# Patient Record
Sex: Male | Born: 1935
Health system: Southern US, Community
[De-identification: ages and names within clinical notes are randomized; demographics above are authoritative.]

## PROBLEM LIST (undated history)

## (undated) DIAGNOSIS — J449 Chronic obstructive pulmonary disease, unspecified: Secondary | ICD-10-CM

## (undated) DIAGNOSIS — C609 Malignant neoplasm of penis, unspecified: Secondary | ICD-10-CM

## (undated) DIAGNOSIS — I10 Essential (primary) hypertension: Secondary | ICD-10-CM

## (undated) DIAGNOSIS — H9190 Unspecified hearing loss, unspecified ear: Secondary | ICD-10-CM

## (undated) DIAGNOSIS — E7800 Pure hypercholesterolemia, unspecified: Secondary | ICD-10-CM

## (undated) DIAGNOSIS — J439 Emphysema, unspecified: Secondary | ICD-10-CM

## (undated) HISTORY — PX: CATARACT EXTRACTION: SUR2

## (undated) HISTORY — PX: LYMPH NODE BIOPSY: SHX201

## (undated) HISTORY — DX: Malignant neoplasm of penis, unspecified: C60.9

## (undated) HISTORY — DX: Pure hypercholesterolemia, unspecified: E78.00

## (undated) HISTORY — DX: Unspecified hearing loss, unspecified ear: H91.90

## (undated) HISTORY — DX: Chronic obstructive pulmonary disease, unspecified: J44.9

## (undated) HISTORY — PX: PENECTOMY: SHX741

## (undated) HISTORY — PX: HIP ARTHROPLASTY: SHX981

## (undated) HISTORY — DX: Essential (primary) hypertension: I10

## (undated) HISTORY — DX: Emphysema, unspecified: J43.9

---

## 2014-09-05 DIAGNOSIS — E780 Pure hypercholesterolemia: Secondary | ICD-10-CM | POA: Diagnosis not present

## 2014-09-05 DIAGNOSIS — I10 Essential (primary) hypertension: Secondary | ICD-10-CM | POA: Diagnosis not present

## 2014-09-05 DIAGNOSIS — D508 Other iron deficiency anemias: Secondary | ICD-10-CM | POA: Diagnosis not present

## 2014-09-05 DIAGNOSIS — N183 Chronic kidney disease, stage 3 (moderate): Secondary | ICD-10-CM | POA: Diagnosis not present

## 2014-09-12 DIAGNOSIS — I10 Essential (primary) hypertension: Secondary | ICD-10-CM | POA: Diagnosis not present

## 2014-09-12 DIAGNOSIS — J438 Other emphysema: Secondary | ICD-10-CM | POA: Diagnosis not present

## 2014-09-12 DIAGNOSIS — M1611 Unilateral primary osteoarthritis, right hip: Secondary | ICD-10-CM | POA: Diagnosis not present

## 2014-09-12 DIAGNOSIS — Z1389 Encounter for screening for other disorder: Secondary | ICD-10-CM | POA: Diagnosis not present

## 2014-09-12 DIAGNOSIS — E780 Pure hypercholesterolemia: Secondary | ICD-10-CM | POA: Diagnosis not present

## 2014-09-12 DIAGNOSIS — N183 Chronic kidney disease, stage 3 (moderate): Secondary | ICD-10-CM | POA: Diagnosis not present

## 2014-12-24 DIAGNOSIS — R569 Unspecified convulsions: Secondary | ICD-10-CM | POA: Diagnosis not present

## 2015-03-07 DIAGNOSIS — E780 Pure hypercholesterolemia: Secondary | ICD-10-CM | POA: Diagnosis not present

## 2015-03-07 DIAGNOSIS — I10 Essential (primary) hypertension: Secondary | ICD-10-CM | POA: Diagnosis not present

## 2015-03-07 DIAGNOSIS — N183 Chronic kidney disease, stage 3 (moderate): Secondary | ICD-10-CM | POA: Diagnosis not present

## 2015-03-14 DIAGNOSIS — N183 Chronic kidney disease, stage 3 (moderate): Secondary | ICD-10-CM | POA: Diagnosis not present

## 2015-03-14 DIAGNOSIS — E780 Pure hypercholesterolemia: Secondary | ICD-10-CM | POA: Diagnosis not present

## 2015-03-14 DIAGNOSIS — M1611 Unilateral primary osteoarthritis, right hip: Secondary | ICD-10-CM | POA: Diagnosis not present

## 2015-03-14 DIAGNOSIS — I10 Essential (primary) hypertension: Secondary | ICD-10-CM | POA: Diagnosis not present

## 2015-03-14 DIAGNOSIS — J438 Other emphysema: Secondary | ICD-10-CM | POA: Diagnosis not present

## 2015-03-14 DIAGNOSIS — Z23 Encounter for immunization: Secondary | ICD-10-CM | POA: Diagnosis not present

## 2015-09-10 DIAGNOSIS — N183 Chronic kidney disease, stage 3 (moderate): Secondary | ICD-10-CM | POA: Diagnosis not present

## 2015-09-10 DIAGNOSIS — I10 Essential (primary) hypertension: Secondary | ICD-10-CM | POA: Diagnosis not present

## 2015-09-10 DIAGNOSIS — E7800 Pure hypercholesterolemia, unspecified: Secondary | ICD-10-CM | POA: Diagnosis not present

## 2015-09-17 DIAGNOSIS — N183 Chronic kidney disease, stage 3 (moderate): Secondary | ICD-10-CM | POA: Diagnosis not present

## 2015-09-17 DIAGNOSIS — J438 Other emphysema: Secondary | ICD-10-CM | POA: Diagnosis not present

## 2015-09-17 DIAGNOSIS — Z13220 Encounter for screening for lipoid disorders: Secondary | ICD-10-CM | POA: Diagnosis not present

## 2015-09-17 DIAGNOSIS — M1611 Unilateral primary osteoarthritis, right hip: Secondary | ICD-10-CM | POA: Diagnosis not present

## 2015-09-17 DIAGNOSIS — I10 Essential (primary) hypertension: Secondary | ICD-10-CM | POA: Diagnosis not present

## 2016-03-11 DIAGNOSIS — H524 Presbyopia: Secondary | ICD-10-CM | POA: Diagnosis not present

## 2016-03-11 DIAGNOSIS — Z961 Presence of intraocular lens: Secondary | ICD-10-CM | POA: Diagnosis not present

## 2016-03-30 DIAGNOSIS — I10 Essential (primary) hypertension: Secondary | ICD-10-CM | POA: Diagnosis not present

## 2016-03-30 DIAGNOSIS — E7800 Pure hypercholesterolemia, unspecified: Secondary | ICD-10-CM | POA: Diagnosis not present

## 2016-03-30 DIAGNOSIS — D508 Other iron deficiency anemias: Secondary | ICD-10-CM | POA: Diagnosis not present

## 2016-03-30 DIAGNOSIS — N183 Chronic kidney disease, stage 3 (moderate): Secondary | ICD-10-CM | POA: Diagnosis not present

## 2016-04-02 DIAGNOSIS — J438 Other emphysema: Secondary | ICD-10-CM | POA: Diagnosis not present

## 2016-04-02 DIAGNOSIS — I10 Essential (primary) hypertension: Secondary | ICD-10-CM | POA: Diagnosis not present

## 2016-04-02 DIAGNOSIS — N183 Chronic kidney disease, stage 3 (moderate): Secondary | ICD-10-CM | POA: Diagnosis not present

## 2016-04-02 DIAGNOSIS — Z23 Encounter for immunization: Secondary | ICD-10-CM | POA: Diagnosis not present

## 2016-04-02 DIAGNOSIS — Z13220 Encounter for screening for lipoid disorders: Secondary | ICD-10-CM | POA: Diagnosis not present

## 2016-04-02 DIAGNOSIS — M1611 Unilateral primary osteoarthritis, right hip: Secondary | ICD-10-CM | POA: Diagnosis not present

## 2016-05-04 DIAGNOSIS — L041 Acute lymphadenitis of trunk: Secondary | ICD-10-CM | POA: Diagnosis not present

## 2016-05-06 DIAGNOSIS — R1909 Other intra-abdominal and pelvic swelling, mass and lump: Secondary | ICD-10-CM | POA: Diagnosis not present

## 2016-05-08 DIAGNOSIS — R1909 Other intra-abdominal and pelvic swelling, mass and lump: Secondary | ICD-10-CM | POA: Diagnosis not present

## 2016-05-13 DIAGNOSIS — R1909 Other intra-abdominal and pelvic swelling, mass and lump: Secondary | ICD-10-CM | POA: Diagnosis not present

## 2016-05-13 DIAGNOSIS — R590 Localized enlarged lymph nodes: Secondary | ICD-10-CM | POA: Diagnosis not present

## 2016-05-18 DIAGNOSIS — R1909 Other intra-abdominal and pelvic swelling, mass and lump: Secondary | ICD-10-CM | POA: Diagnosis not present

## 2016-05-19 DIAGNOSIS — M1990 Unspecified osteoarthritis, unspecified site: Secondary | ICD-10-CM | POA: Diagnosis not present

## 2016-05-19 DIAGNOSIS — E7800 Pure hypercholesterolemia, unspecified: Secondary | ICD-10-CM | POA: Diagnosis not present

## 2016-05-19 DIAGNOSIS — Z96641 Presence of right artificial hip joint: Secondary | ICD-10-CM | POA: Diagnosis not present

## 2016-05-19 DIAGNOSIS — C763 Malignant neoplasm of pelvis: Secondary | ICD-10-CM | POA: Diagnosis not present

## 2016-05-19 DIAGNOSIS — C774 Secondary and unspecified malignant neoplasm of inguinal and lower limb lymph nodes: Secondary | ICD-10-CM | POA: Diagnosis not present

## 2016-05-19 DIAGNOSIS — Z880 Allergy status to penicillin: Secondary | ICD-10-CM | POA: Diagnosis not present

## 2016-05-19 DIAGNOSIS — R1909 Other intra-abdominal and pelvic swelling, mass and lump: Secondary | ICD-10-CM | POA: Diagnosis not present

## 2016-05-19 DIAGNOSIS — C801 Malignant (primary) neoplasm, unspecified: Secondary | ICD-10-CM | POA: Diagnosis not present

## 2016-05-19 DIAGNOSIS — Z7982 Long term (current) use of aspirin: Secondary | ICD-10-CM | POA: Diagnosis not present

## 2016-05-19 DIAGNOSIS — Z79899 Other long term (current) drug therapy: Secondary | ICD-10-CM | POA: Diagnosis not present

## 2016-05-19 DIAGNOSIS — J439 Emphysema, unspecified: Secondary | ICD-10-CM | POA: Diagnosis not present

## 2016-05-19 DIAGNOSIS — R590 Localized enlarged lymph nodes: Secondary | ICD-10-CM | POA: Diagnosis not present

## 2016-05-19 DIAGNOSIS — I10 Essential (primary) hypertension: Secondary | ICD-10-CM | POA: Diagnosis not present

## 2016-05-19 DIAGNOSIS — R599 Enlarged lymph nodes, unspecified: Secondary | ICD-10-CM | POA: Diagnosis not present

## 2016-05-25 DIAGNOSIS — R3915 Urgency of urination: Secondary | ICD-10-CM | POA: Diagnosis not present

## 2016-05-25 DIAGNOSIS — C779 Secondary and unspecified malignant neoplasm of lymph node, unspecified: Secondary | ICD-10-CM | POA: Diagnosis not present

## 2016-06-02 DIAGNOSIS — L7634 Postprocedural seroma of skin and subcutaneous tissue following other procedure: Secondary | ICD-10-CM | POA: Diagnosis not present

## 2016-06-02 DIAGNOSIS — J449 Chronic obstructive pulmonary disease, unspecified: Secondary | ICD-10-CM | POA: Diagnosis not present

## 2016-06-02 DIAGNOSIS — Z85828 Personal history of other malignant neoplasm of skin: Secondary | ICD-10-CM | POA: Diagnosis not present

## 2016-06-02 DIAGNOSIS — Z79899 Other long term (current) drug therapy: Secondary | ICD-10-CM | POA: Diagnosis not present

## 2016-06-02 DIAGNOSIS — I10 Essential (primary) hypertension: Secondary | ICD-10-CM | POA: Diagnosis not present

## 2016-06-02 DIAGNOSIS — Z96641 Presence of right artificial hip joint: Secondary | ICD-10-CM | POA: Diagnosis not present

## 2016-06-02 DIAGNOSIS — Z880 Allergy status to penicillin: Secondary | ICD-10-CM | POA: Diagnosis not present

## 2016-06-02 DIAGNOSIS — R599 Enlarged lymph nodes, unspecified: Secondary | ICD-10-CM | POA: Diagnosis not present

## 2016-06-02 DIAGNOSIS — S301XXA Contusion of abdominal wall, initial encounter: Secondary | ICD-10-CM | POA: Diagnosis not present

## 2016-06-11 DIAGNOSIS — J439 Emphysema, unspecified: Secondary | ICD-10-CM | POA: Diagnosis not present

## 2016-06-11 DIAGNOSIS — I10 Essential (primary) hypertension: Secondary | ICD-10-CM | POA: Diagnosis not present

## 2016-06-11 DIAGNOSIS — C779 Secondary and unspecified malignant neoplasm of lymph node, unspecified: Secondary | ICD-10-CM | POA: Diagnosis not present

## 2016-06-25 DIAGNOSIS — J439 Emphysema, unspecified: Secondary | ICD-10-CM | POA: Diagnosis not present

## 2016-06-25 DIAGNOSIS — I10 Essential (primary) hypertension: Secondary | ICD-10-CM | POA: Diagnosis not present

## 2016-06-25 DIAGNOSIS — M898X8 Other specified disorders of bone, other site: Secondary | ICD-10-CM | POA: Diagnosis not present

## 2016-07-07 DIAGNOSIS — J209 Acute bronchitis, unspecified: Secondary | ICD-10-CM | POA: Diagnosis not present

## 2016-07-07 DIAGNOSIS — J069 Acute upper respiratory infection, unspecified: Secondary | ICD-10-CM | POA: Diagnosis not present

## 2016-07-21 DIAGNOSIS — C600 Malignant neoplasm of prepuce: Secondary | ICD-10-CM | POA: Diagnosis not present

## 2016-07-31 DIAGNOSIS — C609 Malignant neoplasm of penis, unspecified: Secondary | ICD-10-CM | POA: Insufficient documentation

## 2016-08-03 DIAGNOSIS — Z9189 Other specified personal risk factors, not elsewhere classified: Secondary | ICD-10-CM | POA: Diagnosis not present

## 2016-08-03 DIAGNOSIS — I10 Essential (primary) hypertension: Secondary | ICD-10-CM | POA: Diagnosis not present

## 2016-08-03 DIAGNOSIS — M899 Disorder of bone, unspecified: Secondary | ICD-10-CM | POA: Diagnosis not present

## 2016-08-03 DIAGNOSIS — Z01818 Encounter for other preprocedural examination: Secondary | ICD-10-CM | POA: Diagnosis not present

## 2016-08-03 DIAGNOSIS — Z8042 Family history of malignant neoplasm of prostate: Secondary | ICD-10-CM | POA: Diagnosis not present

## 2016-08-03 DIAGNOSIS — Z7982 Long term (current) use of aspirin: Secondary | ICD-10-CM | POA: Diagnosis not present

## 2016-08-03 DIAGNOSIS — Z8249 Family history of ischemic heart disease and other diseases of the circulatory system: Secondary | ICD-10-CM | POA: Diagnosis not present

## 2016-08-03 DIAGNOSIS — Z880 Allergy status to penicillin: Secondary | ICD-10-CM | POA: Diagnosis not present

## 2016-08-03 DIAGNOSIS — Z87891 Personal history of nicotine dependence: Secondary | ICD-10-CM | POA: Diagnosis not present

## 2016-08-03 DIAGNOSIS — Z808 Family history of malignant neoplasm of other organs or systems: Secondary | ICD-10-CM | POA: Diagnosis not present

## 2016-08-03 DIAGNOSIS — C775 Secondary and unspecified malignant neoplasm of intrapelvic lymph nodes: Secondary | ICD-10-CM | POA: Diagnosis not present

## 2016-08-03 DIAGNOSIS — Z86718 Personal history of other venous thrombosis and embolism: Secondary | ICD-10-CM | POA: Diagnosis not present

## 2016-08-03 DIAGNOSIS — Z79899 Other long term (current) drug therapy: Secondary | ICD-10-CM | POA: Diagnosis not present

## 2016-08-03 DIAGNOSIS — H9190 Unspecified hearing loss, unspecified ear: Secondary | ICD-10-CM | POA: Diagnosis not present

## 2016-08-03 DIAGNOSIS — Z803 Family history of malignant neoplasm of breast: Secondary | ICD-10-CM | POA: Diagnosis not present

## 2016-08-03 DIAGNOSIS — J439 Emphysema, unspecified: Secondary | ICD-10-CM | POA: Diagnosis not present

## 2016-08-03 DIAGNOSIS — Z886 Allergy status to analgesic agent status: Secondary | ICD-10-CM | POA: Diagnosis not present

## 2016-08-03 DIAGNOSIS — R600 Localized edema: Secondary | ICD-10-CM | POA: Diagnosis not present

## 2016-08-03 DIAGNOSIS — J449 Chronic obstructive pulmonary disease, unspecified: Secondary | ICD-10-CM | POA: Diagnosis not present

## 2016-08-03 DIAGNOSIS — Z800 Family history of malignant neoplasm of digestive organs: Secondary | ICD-10-CM | POA: Diagnosis not present

## 2016-08-07 DIAGNOSIS — I10 Essential (primary) hypertension: Secondary | ICD-10-CM | POA: Diagnosis not present

## 2016-08-07 DIAGNOSIS — Z7951 Long term (current) use of inhaled steroids: Secondary | ICD-10-CM | POA: Diagnosis not present

## 2016-08-07 DIAGNOSIS — J439 Emphysema, unspecified: Secondary | ICD-10-CM | POA: Diagnosis not present

## 2016-08-07 DIAGNOSIS — Z8042 Family history of malignant neoplasm of prostate: Secondary | ICD-10-CM | POA: Diagnosis not present

## 2016-08-07 DIAGNOSIS — Z880 Allergy status to penicillin: Secondary | ICD-10-CM | POA: Diagnosis not present

## 2016-08-07 DIAGNOSIS — Z79899 Other long term (current) drug therapy: Secondary | ICD-10-CM | POA: Diagnosis not present

## 2016-08-07 DIAGNOSIS — Z96641 Presence of right artificial hip joint: Secondary | ICD-10-CM | POA: Diagnosis not present

## 2016-08-07 DIAGNOSIS — Z7982 Long term (current) use of aspirin: Secondary | ICD-10-CM | POA: Diagnosis not present

## 2016-08-07 DIAGNOSIS — R600 Localized edema: Secondary | ICD-10-CM | POA: Diagnosis not present

## 2016-08-07 DIAGNOSIS — Z800 Family history of malignant neoplasm of digestive organs: Secondary | ICD-10-CM | POA: Diagnosis not present

## 2016-08-07 DIAGNOSIS — H9190 Unspecified hearing loss, unspecified ear: Secondary | ICD-10-CM | POA: Diagnosis not present

## 2016-08-07 DIAGNOSIS — Z87891 Personal history of nicotine dependence: Secondary | ICD-10-CM | POA: Diagnosis not present

## 2016-08-07 DIAGNOSIS — E871 Hypo-osmolality and hyponatremia: Secondary | ICD-10-CM | POA: Diagnosis not present

## 2016-08-08 DIAGNOSIS — J439 Emphysema, unspecified: Secondary | ICD-10-CM | POA: Diagnosis not present

## 2016-08-08 DIAGNOSIS — H9190 Unspecified hearing loss, unspecified ear: Secondary | ICD-10-CM | POA: Diagnosis not present

## 2016-08-08 DIAGNOSIS — Z96641 Presence of right artificial hip joint: Secondary | ICD-10-CM | POA: Diagnosis not present

## 2016-08-08 DIAGNOSIS — R600 Localized edema: Secondary | ICD-10-CM | POA: Diagnosis not present

## 2016-08-08 DIAGNOSIS — Z87891 Personal history of nicotine dependence: Secondary | ICD-10-CM | POA: Diagnosis not present

## 2016-08-08 DIAGNOSIS — Z880 Allergy status to penicillin: Secondary | ICD-10-CM | POA: Diagnosis not present

## 2016-08-08 DIAGNOSIS — E871 Hypo-osmolality and hyponatremia: Secondary | ICD-10-CM | POA: Diagnosis not present

## 2016-08-08 DIAGNOSIS — Z7982 Long term (current) use of aspirin: Secondary | ICD-10-CM | POA: Diagnosis not present

## 2016-08-08 DIAGNOSIS — I10 Essential (primary) hypertension: Secondary | ICD-10-CM | POA: Diagnosis not present

## 2016-08-08 DIAGNOSIS — Z79899 Other long term (current) drug therapy: Secondary | ICD-10-CM | POA: Diagnosis not present

## 2016-08-08 DIAGNOSIS — Z7951 Long term (current) use of inhaled steroids: Secondary | ICD-10-CM | POA: Diagnosis not present

## 2016-08-08 DIAGNOSIS — Z800 Family history of malignant neoplasm of digestive organs: Secondary | ICD-10-CM | POA: Diagnosis not present

## 2016-08-08 DIAGNOSIS — Z8042 Family history of malignant neoplasm of prostate: Secondary | ICD-10-CM | POA: Diagnosis not present

## 2016-08-09 DIAGNOSIS — Z87891 Personal history of nicotine dependence: Secondary | ICD-10-CM | POA: Diagnosis not present

## 2016-08-09 DIAGNOSIS — Z96641 Presence of right artificial hip joint: Secondary | ICD-10-CM | POA: Diagnosis not present

## 2016-08-09 DIAGNOSIS — E871 Hypo-osmolality and hyponatremia: Secondary | ICD-10-CM | POA: Diagnosis not present

## 2016-08-09 DIAGNOSIS — J439 Emphysema, unspecified: Secondary | ICD-10-CM | POA: Diagnosis not present

## 2016-08-09 DIAGNOSIS — Z7982 Long term (current) use of aspirin: Secondary | ICD-10-CM | POA: Diagnosis not present

## 2016-08-09 DIAGNOSIS — I10 Essential (primary) hypertension: Secondary | ICD-10-CM | POA: Diagnosis not present

## 2016-08-09 DIAGNOSIS — Z800 Family history of malignant neoplasm of digestive organs: Secondary | ICD-10-CM | POA: Diagnosis not present

## 2016-08-09 DIAGNOSIS — Z7951 Long term (current) use of inhaled steroids: Secondary | ICD-10-CM | POA: Diagnosis not present

## 2016-08-09 DIAGNOSIS — Z79899 Other long term (current) drug therapy: Secondary | ICD-10-CM | POA: Diagnosis not present

## 2016-08-09 DIAGNOSIS — Z8042 Family history of malignant neoplasm of prostate: Secondary | ICD-10-CM | POA: Diagnosis not present

## 2016-08-09 DIAGNOSIS — R600 Localized edema: Secondary | ICD-10-CM | POA: Diagnosis not present

## 2016-08-09 DIAGNOSIS — H9190 Unspecified hearing loss, unspecified ear: Secondary | ICD-10-CM | POA: Diagnosis not present

## 2016-08-09 DIAGNOSIS — Z880 Allergy status to penicillin: Secondary | ICD-10-CM | POA: Diagnosis not present

## 2016-08-10 DIAGNOSIS — R319 Hematuria, unspecified: Secondary | ICD-10-CM | POA: Diagnosis not present

## 2016-08-10 DIAGNOSIS — I10 Essential (primary) hypertension: Secondary | ICD-10-CM | POA: Diagnosis not present

## 2016-08-10 DIAGNOSIS — T83091A Other mechanical complication of indwelling urethral catheter, initial encounter: Secondary | ICD-10-CM | POA: Diagnosis not present

## 2016-08-10 DIAGNOSIS — Z9889 Other specified postprocedural states: Secondary | ICD-10-CM | POA: Diagnosis not present

## 2016-08-10 DIAGNOSIS — Z7982 Long term (current) use of aspirin: Secondary | ICD-10-CM | POA: Diagnosis not present

## 2016-08-10 DIAGNOSIS — Z79899 Other long term (current) drug therapy: Secondary | ICD-10-CM | POA: Diagnosis not present

## 2016-08-10 DIAGNOSIS — J439 Emphysema, unspecified: Secondary | ICD-10-CM | POA: Diagnosis not present

## 2016-08-17 DIAGNOSIS — J439 Emphysema, unspecified: Secondary | ICD-10-CM | POA: Diagnosis not present

## 2016-08-17 DIAGNOSIS — Z9889 Other specified postprocedural states: Secondary | ICD-10-CM | POA: Diagnosis not present

## 2016-08-17 DIAGNOSIS — N433 Hydrocele, unspecified: Secondary | ICD-10-CM | POA: Diagnosis not present

## 2016-08-17 DIAGNOSIS — H9190 Unspecified hearing loss, unspecified ear: Secondary | ICD-10-CM | POA: Diagnosis not present

## 2016-08-17 DIAGNOSIS — Z880 Allergy status to penicillin: Secondary | ICD-10-CM | POA: Diagnosis not present

## 2016-08-17 DIAGNOSIS — Z87891 Personal history of nicotine dependence: Secondary | ICD-10-CM | POA: Diagnosis not present

## 2016-08-17 DIAGNOSIS — N320 Bladder-neck obstruction: Secondary | ICD-10-CM | POA: Diagnosis not present

## 2016-08-17 DIAGNOSIS — Z7951 Long term (current) use of inhaled steroids: Secondary | ICD-10-CM | POA: Diagnosis not present

## 2016-08-17 DIAGNOSIS — Z7982 Long term (current) use of aspirin: Secondary | ICD-10-CM | POA: Diagnosis not present

## 2016-08-17 DIAGNOSIS — C774 Secondary and unspecified malignant neoplasm of inguinal and lower limb lymph nodes: Secondary | ICD-10-CM | POA: Diagnosis not present

## 2016-08-17 DIAGNOSIS — Z803 Family history of malignant neoplasm of breast: Secondary | ICD-10-CM | POA: Diagnosis not present

## 2016-08-17 DIAGNOSIS — Z79899 Other long term (current) drug therapy: Secondary | ICD-10-CM | POA: Diagnosis not present

## 2016-08-17 DIAGNOSIS — Z8249 Family history of ischemic heart disease and other diseases of the circulatory system: Secondary | ICD-10-CM | POA: Diagnosis not present

## 2016-08-17 DIAGNOSIS — Z86718 Personal history of other venous thrombosis and embolism: Secondary | ICD-10-CM | POA: Diagnosis not present

## 2016-08-27 DIAGNOSIS — Z880 Allergy status to penicillin: Secondary | ICD-10-CM | POA: Diagnosis not present

## 2016-08-27 DIAGNOSIS — Z86718 Personal history of other venous thrombosis and embolism: Secondary | ICD-10-CM | POA: Diagnosis not present

## 2016-08-27 DIAGNOSIS — Z79899 Other long term (current) drug therapy: Secondary | ICD-10-CM | POA: Diagnosis not present

## 2016-08-27 DIAGNOSIS — Z87891 Personal history of nicotine dependence: Secondary | ICD-10-CM | POA: Diagnosis not present

## 2016-08-27 DIAGNOSIS — I10 Essential (primary) hypertension: Secondary | ICD-10-CM | POA: Diagnosis not present

## 2016-08-27 DIAGNOSIS — Z7982 Long term (current) use of aspirin: Secondary | ICD-10-CM | POA: Diagnosis not present

## 2016-08-27 DIAGNOSIS — Z808 Family history of malignant neoplasm of other organs or systems: Secondary | ICD-10-CM | POA: Diagnosis not present

## 2016-08-27 DIAGNOSIS — J449 Chronic obstructive pulmonary disease, unspecified: Secondary | ICD-10-CM | POA: Diagnosis not present

## 2016-08-27 DIAGNOSIS — Z803 Family history of malignant neoplasm of breast: Secondary | ICD-10-CM | POA: Diagnosis not present

## 2016-09-10 DIAGNOSIS — Z510 Encounter for antineoplastic radiation therapy: Secondary | ICD-10-CM | POA: Diagnosis not present

## 2016-09-11 ENCOUNTER — Encounter (HOSPITAL_COMMUNITY): Payer: Medicare Other | Attending: Oncology | Admitting: Oncology

## 2016-09-11 ENCOUNTER — Other Ambulatory Visit (HOSPITAL_COMMUNITY): Payer: Self-pay | Admitting: *Deleted

## 2016-09-11 ENCOUNTER — Encounter (HOSPITAL_COMMUNITY): Payer: Self-pay

## 2016-09-11 ENCOUNTER — Encounter (HOSPITAL_COMMUNITY): Payer: Self-pay | Admitting: *Deleted

## 2016-09-11 VITALS — BP 174/69 | HR 64 | Temp 98.1°F | Resp 18 | Ht 71.0 in | Wt 253.8 lb

## 2016-09-11 DIAGNOSIS — C609 Malignant neoplasm of penis, unspecified: Secondary | ICD-10-CM | POA: Diagnosis not present

## 2016-09-11 DIAGNOSIS — Z510 Encounter for antineoplastic radiation therapy: Secondary | ICD-10-CM | POA: Diagnosis not present

## 2016-09-11 NOTE — Patient Instructions (Signed)
[place] at [facility] Discharge Instructions  RECOMMENDATIONS MADE BY THE CONSULTANT AND ANY TEST RESULTS WILL BE SENT TO YOUR REFERRING PHYSICIAN.  You were seen today by Dr. Twana First We will schedule you for CT scan Follow up in 3 months with lab work See Amy up front for appointments   Thank you for choosing [place] at [facility] to provide your oncology and hematology care.  To afford each patient quality time with our provider, please arrive at least 15 minutes before your scheduled appointment time.    If you have a [facility] appointment [facility] please come in thru the  Main Entrance and check in at the main information desk  You need to re-schedule your appointment should you arrive 10 or more minutes late.  We strive to give you quality time with our providers, and arriving late affects you and other patients whose appointments are after yours.  Also, if you no show three or more times for appointments you may be dismissed from the clinic at the providers discretion.     Again, thank you for choosing [facility]  Our hope is that these requests will decrease the amount of time that you wait before being seen by our physicians.       _____________________________________________________________  Should you have questions after your visit to [facility], please contact our office at [phone] between the hours of 8:30 a.m. and 4:30 p.m.  Voicemails left after 4:30 p.m. will not be returned until the following business day.  For prescription refill requests, have your pharmacy contact our office.       Resources For Cancer Patients and their Caregivers ? American Cancer Society: Can assist with transportation, wigs, general needs, runs Look Good Feel Better.        343 615 5195 ? Cancer Care: Provides financial assistance, online support groups, medication/co-pay assistance.   1-800-813-HOPE (417)290-5682) ? [place] Assists [place] cancer patients and their families through emotional , educational and financial support.  715-572-6311 ? [place] DSS Where to apply for food stamps, Medicaid and utility assistance. (938) 546-9541 ? [employer]: Transportation to medical appointments. 815-016-0978 ? Social Security Administration: May apply for disability if have a Stage IV cancer. 737-773-9371 810-795-3702 ? [employer], Disability and Transit Services: Assists with nutrition, care and transit needs. [place] Support Programs: @10RELATIVEDAYS @ > Cancer Support Group  2nd Tuesday of the month 1pm-2pm, Journey Room  > Creative Journey  3rd Tuesday of the month 1130am-1pm, Journey Room  > Look Good Feel Better  1st Wednesday of the month 10am-12 noon, Journey Room (Call [place] to register [phone])

## 2016-09-11 NOTE — Progress Notes (Signed)
[place] NOTE  Patient Care Team: Manon Hilding, MD as PCP - General (Family Medicine)  CHIEF COMPLAINTS/PURPOSE OF CONSULTATION:  Penile cancer   HISTORY OF PRESENTING ILLNESS:  Christian Sherman 83 y.o. male is here because of referral from Dr. Vanice Sarah for penile cancer.  Per Dr. Derrill Memo note on 08/27/2016: "The patient originally presented to his primary care doctor in October 2017 with a growing right groin mass. CT pelvis on 05/13/2016 revealed a 4.5 cm right inguinal lymphadenopathy. He had an inguinal excision on 05/19/2016, the pathology of which was reviewed at [facility], which revealed metastatic squamous cell carcinoma. The lymph node measured 6.2 cm, was moderately differentiated, and did not have any ECE.   He was then noted to have an ulcerative growth involving the glans and distal shaft of his penile at the end of November. On 06/26/2016, he underwent a punch biopsy of said lesion, which revealed superficially invasive squamous cell carcinoma. On 08/07/2016, he underwent a partial penectomy by Dr. Edd Arbour at [facility]. Pathology revealed a 4.5 cm well-differentiated squamous cell carcinoma, invading 14 mm. Of note, there was no perineural invasion or lymphovascular space invasion. Margins were negative. He did not have a lymph node dissection as he has had chronic bilateral lower extremity lymphedema for 20 years. It is unclear what etiology he has had. He had a PET/CT on 08/17/2016 consistent with N3 disease, demonstrating a right external iliac node, as well as some right inguinal activity. He has met with med onc apparently at [facility], but desires treatment closer to home."   Christian Sherman is doing well overall. He presents to the clinic accompanied by his daughter today. He is hard of hearing. He will begin radiation treatment on Monday with Dr. Lianne Cure and was told he would receive 6 to 7 weeks of treatment. He denies urinary disturbances, shortness of breath,  or chest pain. He denies change in bowels or change in appetite.     Penile cancer ([place])   05/13/2016 Imaging    CT pelvis [facility] 4.5 cm right inguinal lymphadenopathy, evidence of central necrosis, surrounding inflammation.      05/19/2016 Initial Biopsy    Right inguinal LN excision: metastatic squamous cell carcinoma, moderately differentiated, 6.2 cm, no definite extracapsular invasion.      06/26/2016 Initial Biopsy    Punch biopsy of penis: superficially invasive [facility]      07/31/2016 Initial Diagnosis    Penile cancer ([place])      08/07/2016 Surgery    Partial penectomy by Dr. Edd Arbour at [place]: well differentiated SCC, 4.4 cm, with depth of invasion of 14 mm, no perineural invasion or lymphovascular space invasion, margins negative, no lymph nodes submitted       08/17/2016 Imaging    PET-Ct: several large hypermetabolic right inguinal and external iliac lymph nodes worrisome for metastasis. [place]ak in the penis at the resection site, significance unclear, may be postsurgical, recommend clinical correlation.       MEDICAL HISTORY:  Past Medical History:  Diagnosis Date  . COPD (chronic obstructive pulmonary disease) ([place])   . Emphysema lung ([place])   . Hard of hearing   . Hypercholesteremia   . Hypertension   . Penile cancer [facility])     SURGICAL HISTORY: Past Surgical History:  Procedure Laterality Date  . CATARACT EXTRACTION Bilateral   . HIP ARTHROPLASTY Right   . LYMPH NODE BIOPSY Right    right groin lymphnode biopsy  . PENECTOMY  partial    SOCIAL HISTORY: Social History   Social History  . Marital status: Widowed    Spouse name: N/A  . Number of children: N/A  . Years of education: N/A   Occupational History  . Not on file.   Social History Main Topics  . Smoking status: Former Smoker    Types: Cigarettes    Quit date: 07/13/1965  . Smokeless tobacco: Never Used  . Alcohol use No  . Drug use: No  . Sexual activity: No   Other Topics  Concern  . Not on file   Social History Narrative  . No narrative on file    FAMILY HISTORY: Family History  Problem Relation Age of Onset  . Cancer Mother   . Heart disease Father   . Cancer Sister   . Stomach cancer Brother   . Pancreatic cancer Brother   . Heart disease Sister   . Heart disease Sister     ALLERGIES:  is allergic to penicillin g.  MEDICATIONS:  Current Outpatient Prescriptions  Medication Sig Dispense Refill  . polyethylene glycol (MIRALAX / GLYCOLAX) packet Take 17 g by mouth daily as needed.    Marland Kitchen acetaminophen (TYLENOL) 650 MG CR tablet Take 1,300 mg by mouth.    Marland Kitchen albuterol (PROVENTIL HFA;VENTOLIN HFA) 108 (90 Base) MCG/ACT inhaler Inhale into the lungs.    Marland Kitchen aspirin EC 81 MG tablet Take 81 mg by mouth.    . bacitracin 500 UNIT/GM ointment Apply topically.    . Cholecalciferol (VITAMIN D3) 5000 units TABS Take by mouth.    Marland Kitchen lisinopril-hydrochlorothiazide (PRINZIDE,ZESTORETIC) 20-25 MG tablet Take by mouth.    . Melatonin 3 MG TABS Take 3 mg by mouth.    . multivitamin (ONE-A-DAY MEN'S) TABS tablet Take by mouth.    . Red Yeast Rice 600 MG TABS Take 1,800 mg by mouth.    . tamsulosin (FLOMAX) 0.4 MG CAPS capsule Take 0.4 mg by mouth.     No current facility-administered medications for this visit.     Review of Systems  Constitutional: Negative.   HENT: Negative.   Eyes: Negative.   Respiratory: Negative.   Cardiovascular: Positive for leg swelling (chronic).  Gastrointestinal: Negative.   Genitourinary: Negative.   Musculoskeletal: Negative.   Skin: Negative.   Neurological: Negative.   Endo/Heme/Allergies: Negative.   Psychiatric/Behavioral: Negative.   All other systems reviewed and are negative.  14 point ROS was done and is otherwise as detailed above or in HPI   PHYSICAL EXAMINATION: ECOG PERFORMANCE STATUS: 0 - Asymptomatic  Vitals:   09/11/16 0850  BP: (!) 174/69  Pulse: 64  Resp: 18  Temp: 98.1 F (36.7 C)   Filed  Weights   09/11/16 0850  Weight: 253 lb 12.8 oz (115.1 kg)     Physical Exam  Constitutional: He is oriented to person, place, and time and well-developed, well-nourished, and in no distress.  HENT:  Head: Normocephalic and atraumatic.  Mouth/Throat: Oropharynx is clear and moist.  Eyes: Conjunctivae and EOM are normal. Pupils are equal, round, and reactive to light.  Neck: Normal range of motion. Neck supple.  Cardiovascular: Normal rate, regular rhythm and normal heart sounds.   Pulmonary/Chest: Effort normal and breath sounds normal.  Abdominal: Soft. Bowel sounds are normal.  Genitourinary:  Genitourinary Comments: Status post partial penectomy well healed. Large palpable right inguinal lymphadenopathy. No left inguinal lymphadenopathy palpated.  Musculoskeletal: Normal range of motion. He exhibits edema.  Bilateral lower extremity edema  Neurological:  He is alert and oriented to person, place, and time. Gait normal.  Skin: Skin is warm and dry.  Nursing note and vitals reviewed.   LABORATORY DATA:  I have reviewed the data as listed No results found for: WBC, HGB, HCT, MCV, PLT CMP  No results found for: NA, K, CL, CO2, GLUCOSE, BUN, CREATININE, CALCIUM, PROT, ALBUMIN, AST, ALT, ALKPHOS, BILITOT, GFRNONAA, GFRAA     PATHOLOGY: Pathology PENIS  Penis - All Specimens  SPECIMEN  Procedure Partial penectomy  Foreskin Present (uncircumcised)  Medium  TUMOR  Tumor Site Glans  Foreskin mucosal surface  Coronal sulcus (balanopreputial sulcus)  Penile urethra  Tumor Macroscopic Features Ulcerated  Histologic Type Squamous cell carcinoma, usual type  Histologic Grade G1: Well-differentiated  Tumor Size Greatest dimension in Centimeters (cm): 4.4 Centimeters (cm)  Tumor Thickness / Depth of Invasion in Millimeters (mm) 14 Millimeters (mm)  Tumor Deep Borders Infiltrative (jagged)  Tumor Focality Unifocal  Tumor Extension Tumor invades lamina propria  Tumor invades  dermis  Tumor invades corpus spongiosum  Tumor invades penile (distal) urethra  Lymphovascular Invasion Not identified  Perineural Invasion Not identified  MARGINS  Margins Uninvolved  LYMPH NODES  Regional Lymph Nodes No lymph nodes submitted or found  PATHOLOGIC STAGE CLASSIFICATION (pTNM, AJCC 8th Edition)  Primary Tumor (pT) pT2  Regional Lymph Nodes (pN) pNX  Comment(s)  Comment(s) A prior lymph node biopsy ([id number]) showed a 6.2 cm metastatic squamous cell carcinoma in a right inguinal lymph node, indeterminate for extracapsular invasion. That information should be taken into consideration when clinically staging this patient.       RADIOGRAPHIC STUDIES: I have personally reviewed the radiological images as listed and agreed with the findings in the report.  PET 08/17/2016 Result Impression  - several large hypermetabolic right inguinal and external iliac lymph nodes worrisome for metastases.  - Uptake in the penis at the resection site, significance unclear, may be postsurgical, recommend clinical correlation.    ASSESSMENT & PLAN:  Stage IV (pT2pN3cM0) penile SCC s/p partial penectomy on 08/07/2016.   PLAN: Proceed with radiation treatment under the care of Dr. Lianne Cure. He will receive 6 to 7 weeks treatment. Chemotherapy is not recommended at this time since patient does not have metastatic disease.   We will continue with surveillance after he completes radiation therapy.  RTC in 3 months with CBC CMP and will undergo a restaging CT chest abdomen pelvis prior to the next visit.   ORDERS PLACED FOR THIS ENCOUNTER: Orders Placed This Encounter  Procedures  . CT Chest W Contrast  . CT Abdomen Pelvis W Contrast  . CBC with Differential  . Comprehensive metabolic panel    MEDICATIONS PRESCRIBED THIS ENCOUNTER: Meds ordered this encounter  Medications  . Red Yeast Rice 600 MG TABS    Sig: Take 1,800 mg by mouth.  . multivitamin (ONE-A-DAY MEN'S) TABS tablet      Sig: Take by mouth.  . polyethylene glycol (MIRALAX / GLYCOLAX) packet    Sig: Take 17 g by mouth daily as needed.     All questions were answered. The patient knows to call the clinic with any problems, questions or concerns.  I spent 30 minutes counseling the patient face to face. The total time spent in the appointment was 45 minutes and more than 50% was on counseling and review of test results  This document serves as a record of services personally performed by Twana First, MD. It was created on her behalf by Benjamine Mola  Caryl Pina, a trained medical scribe. The creation of this record is based on the scribe's personal observations and the provider's statements to them. This document has been checked and approved by the attending provider.  I have reviewed the above documentation for accuracy and completeness and I agree with the above.  This note was electronically signed.    Twana First, MD 09/11/2016 9:11 AM

## 2016-09-17 DIAGNOSIS — Z510 Encounter for antineoplastic radiation therapy: Secondary | ICD-10-CM | POA: Diagnosis not present

## 2016-09-18 DIAGNOSIS — Z510 Encounter for antineoplastic radiation therapy: Secondary | ICD-10-CM | POA: Diagnosis not present

## 2016-09-21 DIAGNOSIS — Z510 Encounter for antineoplastic radiation therapy: Secondary | ICD-10-CM | POA: Diagnosis not present

## 2016-09-22 DIAGNOSIS — Z510 Encounter for antineoplastic radiation therapy: Secondary | ICD-10-CM | POA: Diagnosis not present

## 2016-09-23 DIAGNOSIS — E7800 Pure hypercholesterolemia, unspecified: Secondary | ICD-10-CM | POA: Diagnosis not present

## 2016-09-23 DIAGNOSIS — Z510 Encounter for antineoplastic radiation therapy: Secondary | ICD-10-CM | POA: Diagnosis not present

## 2016-09-23 DIAGNOSIS — I10 Essential (primary) hypertension: Secondary | ICD-10-CM | POA: Diagnosis not present

## 2016-09-24 DIAGNOSIS — Z510 Encounter for antineoplastic radiation therapy: Secondary | ICD-10-CM | POA: Diagnosis not present

## 2016-09-25 DIAGNOSIS — N183 Chronic kidney disease, stage 3 (moderate): Secondary | ICD-10-CM | POA: Diagnosis not present

## 2016-09-25 DIAGNOSIS — E7800 Pure hypercholesterolemia, unspecified: Secondary | ICD-10-CM | POA: Diagnosis not present

## 2016-09-25 DIAGNOSIS — Z510 Encounter for antineoplastic radiation therapy: Secondary | ICD-10-CM | POA: Diagnosis not present

## 2016-09-25 DIAGNOSIS — J438 Other emphysema: Secondary | ICD-10-CM | POA: Diagnosis not present

## 2016-09-25 DIAGNOSIS — I10 Essential (primary) hypertension: Secondary | ICD-10-CM | POA: Diagnosis not present

## 2016-09-25 DIAGNOSIS — M1611 Unilateral primary osteoarthritis, right hip: Secondary | ICD-10-CM | POA: Diagnosis not present

## 2016-09-28 DIAGNOSIS — Z510 Encounter for antineoplastic radiation therapy: Secondary | ICD-10-CM | POA: Diagnosis not present

## 2016-09-29 DIAGNOSIS — Z510 Encounter for antineoplastic radiation therapy: Secondary | ICD-10-CM | POA: Diagnosis not present

## 2016-09-30 DIAGNOSIS — Z510 Encounter for antineoplastic radiation therapy: Secondary | ICD-10-CM | POA: Diagnosis not present

## 2016-10-01 DIAGNOSIS — Z510 Encounter for antineoplastic radiation therapy: Secondary | ICD-10-CM | POA: Diagnosis not present

## 2016-10-02 DIAGNOSIS — Z510 Encounter for antineoplastic radiation therapy: Secondary | ICD-10-CM | POA: Diagnosis not present

## 2016-10-05 DIAGNOSIS — Z510 Encounter for antineoplastic radiation therapy: Secondary | ICD-10-CM | POA: Diagnosis not present

## 2016-10-06 DIAGNOSIS — Z510 Encounter for antineoplastic radiation therapy: Secondary | ICD-10-CM | POA: Diagnosis not present

## 2016-10-07 DIAGNOSIS — Z510 Encounter for antineoplastic radiation therapy: Secondary | ICD-10-CM | POA: Diagnosis not present

## 2016-10-08 DIAGNOSIS — Z510 Encounter for antineoplastic radiation therapy: Secondary | ICD-10-CM | POA: Diagnosis not present

## 2016-10-12 DIAGNOSIS — Z510 Encounter for antineoplastic radiation therapy: Secondary | ICD-10-CM | POA: Diagnosis not present

## 2016-10-13 DIAGNOSIS — Z510 Encounter for antineoplastic radiation therapy: Secondary | ICD-10-CM | POA: Diagnosis not present

## 2016-10-14 DIAGNOSIS — Z510 Encounter for antineoplastic radiation therapy: Secondary | ICD-10-CM | POA: Diagnosis not present

## 2016-10-15 DIAGNOSIS — Z510 Encounter for antineoplastic radiation therapy: Secondary | ICD-10-CM | POA: Diagnosis not present

## 2016-10-16 DIAGNOSIS — Z510 Encounter for antineoplastic radiation therapy: Secondary | ICD-10-CM | POA: Diagnosis not present

## 2016-10-19 DIAGNOSIS — Z510 Encounter for antineoplastic radiation therapy: Secondary | ICD-10-CM | POA: Diagnosis not present

## 2016-10-20 DIAGNOSIS — Z510 Encounter for antineoplastic radiation therapy: Secondary | ICD-10-CM | POA: Diagnosis not present

## 2016-10-21 DIAGNOSIS — Z510 Encounter for antineoplastic radiation therapy: Secondary | ICD-10-CM | POA: Diagnosis not present

## 2016-10-22 DIAGNOSIS — Z510 Encounter for antineoplastic radiation therapy: Secondary | ICD-10-CM | POA: Diagnosis not present

## 2016-10-23 DIAGNOSIS — Z510 Encounter for antineoplastic radiation therapy: Secondary | ICD-10-CM | POA: Diagnosis not present

## 2016-10-26 DIAGNOSIS — Z510 Encounter for antineoplastic radiation therapy: Secondary | ICD-10-CM | POA: Diagnosis not present

## 2016-10-27 DIAGNOSIS — Z510 Encounter for antineoplastic radiation therapy: Secondary | ICD-10-CM | POA: Diagnosis not present

## 2016-10-28 DIAGNOSIS — Z510 Encounter for antineoplastic radiation therapy: Secondary | ICD-10-CM | POA: Diagnosis not present

## 2016-10-29 DIAGNOSIS — Z510 Encounter for antineoplastic radiation therapy: Secondary | ICD-10-CM | POA: Diagnosis not present

## 2016-12-11 ENCOUNTER — Other Ambulatory Visit (HOSPITAL_COMMUNITY): Payer: Medicare Other

## 2016-12-11 ENCOUNTER — Ambulatory Visit (HOSPITAL_COMMUNITY)
Admission: RE | Admit: 2016-12-11 | Discharge: 2016-12-11 | Disposition: A | Discharge status: Home / Self Care | Payer: Medicare Other | Source: Ambulatory Visit | Attending: Oncology | Admitting: Oncology

## 2016-12-11 ENCOUNTER — Encounter (HOSPITAL_COMMUNITY): Payer: Self-pay

## 2016-12-11 DIAGNOSIS — I2510 Atherosclerotic heart disease of native coronary artery without angina pectoris: Secondary | ICD-10-CM | POA: Diagnosis not present

## 2016-12-11 DIAGNOSIS — C609 Malignant neoplasm of penis, unspecified: Secondary | ICD-10-CM

## 2016-12-11 DIAGNOSIS — M12852 Other specific arthropathies, not elsewhere classified, left hip: Secondary | ICD-10-CM | POA: Diagnosis not present

## 2016-12-11 DIAGNOSIS — M47896 Other spondylosis, lumbar region: Secondary | ICD-10-CM | POA: Insufficient documentation

## 2016-12-11 DIAGNOSIS — M5136 Other intervertebral disc degeneration, lumbar region: Secondary | ICD-10-CM | POA: Insufficient documentation

## 2016-12-11 DIAGNOSIS — R918 Other nonspecific abnormal finding of lung field: Secondary | ICD-10-CM | POA: Diagnosis not present

## 2016-12-11 DIAGNOSIS — R938 Abnormal findings on diagnostic imaging of other specified body structures: Secondary | ICD-10-CM | POA: Insufficient documentation

## 2016-12-11 DIAGNOSIS — R590 Localized enlarged lymph nodes: Secondary | ICD-10-CM | POA: Diagnosis not present

## 2016-12-11 DIAGNOSIS — J479 Bronchiectasis, uncomplicated: Secondary | ICD-10-CM | POA: Insufficient documentation

## 2016-12-11 DIAGNOSIS — I700 Atherosclerosis of aorta: Secondary | ICD-10-CM | POA: Diagnosis not present

## 2016-12-11 DIAGNOSIS — C762 Malignant neoplasm of abdomen: Secondary | ICD-10-CM | POA: Diagnosis not present

## 2016-12-11 LAB — POCT I-STAT CREATININE: Creatinine, Ser: 1.2 mg/dL (ref 0.61–1.24)

## 2016-12-11 MED ORDER — IOPAMIDOL (ISOVUE-300) INJECTION 61%
100.0000 mL | Freq: Once | INTRAVENOUS | Status: AC | PRN
  Administered 2016-12-11: 100 mL via INTRAVENOUS

## 2016-12-15 ENCOUNTER — Ambulatory Visit (HOSPITAL_COMMUNITY): Payer: Medicare Other

## 2016-12-18 DIAGNOSIS — J439 Emphysema, unspecified: Secondary | ICD-10-CM | POA: Diagnosis not present

## 2016-12-18 DIAGNOSIS — R609 Edema, unspecified: Secondary | ICD-10-CM | POA: Diagnosis not present

## 2016-12-18 DIAGNOSIS — M1611 Unilateral primary osteoarthritis, right hip: Secondary | ICD-10-CM | POA: Diagnosis not present

## 2016-12-18 DIAGNOSIS — R0602 Shortness of breath: Secondary | ICD-10-CM | POA: Diagnosis not present

## 2016-12-18 DIAGNOSIS — I10 Essential (primary) hypertension: Secondary | ICD-10-CM | POA: Diagnosis not present

## 2016-12-18 DIAGNOSIS — Z86718 Personal history of other venous thrombosis and embolism: Secondary | ICD-10-CM | POA: Diagnosis not present

## 2016-12-18 DIAGNOSIS — E785 Hyperlipidemia, unspecified: Secondary | ICD-10-CM | POA: Diagnosis not present

## 2017-01-04 DIAGNOSIS — M1990 Unspecified osteoarthritis, unspecified site: Secondary | ICD-10-CM | POA: Diagnosis not present

## 2017-01-04 DIAGNOSIS — J439 Emphysema, unspecified: Secondary | ICD-10-CM | POA: Diagnosis not present

## 2017-01-04 DIAGNOSIS — Z79899 Other long term (current) drug therapy: Secondary | ICD-10-CM | POA: Diagnosis not present

## 2017-01-04 DIAGNOSIS — L03115 Cellulitis of right lower limb: Secondary | ICD-10-CM | POA: Diagnosis not present

## 2017-01-04 DIAGNOSIS — E7800 Pure hypercholesterolemia, unspecified: Secondary | ICD-10-CM | POA: Diagnosis not present

## 2017-01-04 DIAGNOSIS — I10 Essential (primary) hypertension: Secondary | ICD-10-CM | POA: Diagnosis not present

## 2017-01-04 DIAGNOSIS — Z23 Encounter for immunization: Secondary | ICD-10-CM | POA: Diagnosis not present

## 2017-01-04 DIAGNOSIS — Z7982 Long term (current) use of aspirin: Secondary | ICD-10-CM | POA: Diagnosis not present

## 2017-01-04 DIAGNOSIS — W228XXA Striking against or struck by other objects, initial encounter: Secondary | ICD-10-CM | POA: Diagnosis not present

## 2017-01-04 DIAGNOSIS — S80811A Abrasion, right lower leg, initial encounter: Secondary | ICD-10-CM | POA: Diagnosis not present

## 2017-01-06 DIAGNOSIS — R609 Edema, unspecified: Secondary | ICD-10-CM | POA: Diagnosis not present

## 2017-01-06 DIAGNOSIS — N183 Chronic kidney disease, stage 3 (moderate): Secondary | ICD-10-CM | POA: Diagnosis not present

## 2017-01-06 DIAGNOSIS — I872 Venous insufficiency (chronic) (peripheral): Secondary | ICD-10-CM | POA: Diagnosis not present

## 2017-01-06 DIAGNOSIS — S81801A Unspecified open wound, right lower leg, initial encounter: Secondary | ICD-10-CM | POA: Diagnosis not present

## 2017-01-09 DIAGNOSIS — S81801D Unspecified open wound, right lower leg, subsequent encounter: Secondary | ICD-10-CM | POA: Diagnosis not present

## 2017-01-21 DIAGNOSIS — Z7982 Long term (current) use of aspirin: Secondary | ICD-10-CM | POA: Diagnosis not present

## 2017-01-21 DIAGNOSIS — L97812 Non-pressure chronic ulcer of other part of right lower leg with fat layer exposed: Secondary | ICD-10-CM | POA: Diagnosis not present

## 2017-01-21 DIAGNOSIS — I129 Hypertensive chronic kidney disease with stage 1 through stage 4 chronic kidney disease, or unspecified chronic kidney disease: Secondary | ICD-10-CM | POA: Diagnosis not present

## 2017-01-21 DIAGNOSIS — I83018 Varicose veins of right lower extremity with ulcer other part of lower leg: Secondary | ICD-10-CM | POA: Diagnosis not present

## 2017-01-21 DIAGNOSIS — Z880 Allergy status to penicillin: Secondary | ICD-10-CM | POA: Diagnosis not present

## 2017-01-21 DIAGNOSIS — I8393 Asymptomatic varicose veins of bilateral lower extremities: Secondary | ICD-10-CM | POA: Diagnosis not present

## 2017-01-21 DIAGNOSIS — N183 Chronic kidney disease, stage 3 (moderate): Secondary | ICD-10-CM | POA: Diagnosis not present

## 2017-01-21 DIAGNOSIS — L97818 Non-pressure chronic ulcer of other part of right lower leg with other specified severity: Secondary | ICD-10-CM | POA: Diagnosis not present

## 2017-01-21 DIAGNOSIS — Z79899 Other long term (current) drug therapy: Secondary | ICD-10-CM | POA: Diagnosis not present

## 2017-01-28 DIAGNOSIS — I83018 Varicose veins of right lower extremity with ulcer other part of lower leg: Secondary | ICD-10-CM | POA: Diagnosis not present

## 2017-01-28 DIAGNOSIS — I8393 Asymptomatic varicose veins of bilateral lower extremities: Secondary | ICD-10-CM | POA: Diagnosis not present

## 2017-01-28 DIAGNOSIS — L97812 Non-pressure chronic ulcer of other part of right lower leg with fat layer exposed: Secondary | ICD-10-CM | POA: Diagnosis not present

## 2017-01-28 DIAGNOSIS — Z79899 Other long term (current) drug therapy: Secondary | ICD-10-CM | POA: Diagnosis not present

## 2017-01-28 DIAGNOSIS — L97818 Non-pressure chronic ulcer of other part of right lower leg with other specified severity: Secondary | ICD-10-CM | POA: Diagnosis not present

## 2017-01-28 DIAGNOSIS — Z880 Allergy status to penicillin: Secondary | ICD-10-CM | POA: Diagnosis not present

## 2017-01-28 DIAGNOSIS — Z7982 Long term (current) use of aspirin: Secondary | ICD-10-CM | POA: Diagnosis not present

## 2017-01-28 DIAGNOSIS — N183 Chronic kidney disease, stage 3 (moderate): Secondary | ICD-10-CM | POA: Diagnosis not present

## 2017-01-28 DIAGNOSIS — I129 Hypertensive chronic kidney disease with stage 1 through stage 4 chronic kidney disease, or unspecified chronic kidney disease: Secondary | ICD-10-CM | POA: Diagnosis not present

## 2017-02-03 DIAGNOSIS — M79604 Pain in right leg: Secondary | ICD-10-CM | POA: Diagnosis not present

## 2017-02-03 DIAGNOSIS — L98499 Non-pressure chronic ulcer of skin of other sites with unspecified severity: Secondary | ICD-10-CM | POA: Diagnosis not present

## 2017-02-03 DIAGNOSIS — I87323 Chronic venous hypertension (idiopathic) with inflammation of bilateral lower extremity: Secondary | ICD-10-CM | POA: Diagnosis not present

## 2017-02-03 DIAGNOSIS — M79605 Pain in left leg: Secondary | ICD-10-CM | POA: Diagnosis not present

## 2017-02-04 DIAGNOSIS — L97812 Non-pressure chronic ulcer of other part of right lower leg with fat layer exposed: Secondary | ICD-10-CM | POA: Diagnosis not present

## 2017-02-04 DIAGNOSIS — Z880 Allergy status to penicillin: Secondary | ICD-10-CM | POA: Diagnosis not present

## 2017-02-04 DIAGNOSIS — I8393 Asymptomatic varicose veins of bilateral lower extremities: Secondary | ICD-10-CM | POA: Diagnosis not present

## 2017-02-04 DIAGNOSIS — Z79899 Other long term (current) drug therapy: Secondary | ICD-10-CM | POA: Diagnosis not present

## 2017-02-04 DIAGNOSIS — I129 Hypertensive chronic kidney disease with stage 1 through stage 4 chronic kidney disease, or unspecified chronic kidney disease: Secondary | ICD-10-CM | POA: Diagnosis not present

## 2017-02-04 DIAGNOSIS — Z7982 Long term (current) use of aspirin: Secondary | ICD-10-CM | POA: Diagnosis not present

## 2017-02-04 DIAGNOSIS — N183 Chronic kidney disease, stage 3 (moderate): Secondary | ICD-10-CM | POA: Diagnosis not present

## 2017-02-04 DIAGNOSIS — L97818 Non-pressure chronic ulcer of other part of right lower leg with other specified severity: Secondary | ICD-10-CM | POA: Diagnosis not present

## 2017-02-04 DIAGNOSIS — I83018 Varicose veins of right lower extremity with ulcer other part of lower leg: Secondary | ICD-10-CM | POA: Diagnosis not present

## 2017-02-15 DIAGNOSIS — L97819 Non-pressure chronic ulcer of other part of right lower leg with unspecified severity: Secondary | ICD-10-CM | POA: Diagnosis not present

## 2017-02-15 DIAGNOSIS — I83018 Varicose veins of right lower extremity with ulcer other part of lower leg: Secondary | ICD-10-CM | POA: Diagnosis not present

## 2017-02-15 DIAGNOSIS — I872 Venous insufficiency (chronic) (peripheral): Secondary | ICD-10-CM | POA: Diagnosis not present

## 2017-02-15 DIAGNOSIS — I8392 Asymptomatic varicose veins of left lower extremity: Secondary | ICD-10-CM | POA: Diagnosis not present

## 2017-02-25 DIAGNOSIS — I8392 Asymptomatic varicose veins of left lower extremity: Secondary | ICD-10-CM | POA: Diagnosis not present

## 2017-02-25 DIAGNOSIS — L97819 Non-pressure chronic ulcer of other part of right lower leg with unspecified severity: Secondary | ICD-10-CM | POA: Diagnosis not present

## 2017-02-25 DIAGNOSIS — L97812 Non-pressure chronic ulcer of other part of right lower leg with fat layer exposed: Secondary | ICD-10-CM | POA: Diagnosis not present

## 2017-02-25 DIAGNOSIS — I83018 Varicose veins of right lower extremity with ulcer other part of lower leg: Secondary | ICD-10-CM | POA: Diagnosis not present

## 2017-02-25 DIAGNOSIS — I872 Venous insufficiency (chronic) (peripheral): Secondary | ICD-10-CM | POA: Diagnosis not present

## 2017-03-01 DIAGNOSIS — L97812 Non-pressure chronic ulcer of other part of right lower leg with fat layer exposed: Secondary | ICD-10-CM | POA: Diagnosis not present

## 2017-03-01 DIAGNOSIS — I83018 Varicose veins of right lower extremity with ulcer other part of lower leg: Secondary | ICD-10-CM | POA: Diagnosis not present

## 2017-03-04 DIAGNOSIS — L97812 Non-pressure chronic ulcer of other part of right lower leg with fat layer exposed: Secondary | ICD-10-CM | POA: Diagnosis not present

## 2017-03-04 DIAGNOSIS — I872 Venous insufficiency (chronic) (peripheral): Secondary | ICD-10-CM | POA: Diagnosis not present

## 2017-03-04 DIAGNOSIS — L97819 Non-pressure chronic ulcer of other part of right lower leg with unspecified severity: Secondary | ICD-10-CM | POA: Diagnosis not present

## 2017-03-04 DIAGNOSIS — I83018 Varicose veins of right lower extremity with ulcer other part of lower leg: Secondary | ICD-10-CM | POA: Diagnosis not present

## 2017-03-04 DIAGNOSIS — I8392 Asymptomatic varicose veins of left lower extremity: Secondary | ICD-10-CM | POA: Diagnosis not present

## 2017-03-08 DIAGNOSIS — I872 Venous insufficiency (chronic) (peripheral): Secondary | ICD-10-CM | POA: Diagnosis not present

## 2017-03-11 DIAGNOSIS — I872 Venous insufficiency (chronic) (peripheral): Secondary | ICD-10-CM | POA: Diagnosis not present

## 2017-03-11 DIAGNOSIS — L97819 Non-pressure chronic ulcer of other part of right lower leg with unspecified severity: Secondary | ICD-10-CM | POA: Diagnosis not present

## 2017-03-11 DIAGNOSIS — I8392 Asymptomatic varicose veins of left lower extremity: Secondary | ICD-10-CM | POA: Diagnosis not present

## 2017-03-11 DIAGNOSIS — I83018 Varicose veins of right lower extremity with ulcer other part of lower leg: Secondary | ICD-10-CM | POA: Diagnosis not present

## 2017-03-22 DIAGNOSIS — I872 Venous insufficiency (chronic) (peripheral): Secondary | ICD-10-CM | POA: Diagnosis not present

## 2017-03-25 DIAGNOSIS — N183 Chronic kidney disease, stage 3 (moderate): Secondary | ICD-10-CM | POA: Diagnosis not present

## 2017-03-25 DIAGNOSIS — I10 Essential (primary) hypertension: Secondary | ICD-10-CM | POA: Diagnosis not present

## 2017-03-25 DIAGNOSIS — E7800 Pure hypercholesterolemia, unspecified: Secondary | ICD-10-CM | POA: Diagnosis not present

## 2017-03-29 DIAGNOSIS — Z23 Encounter for immunization: Secondary | ICD-10-CM | POA: Diagnosis not present

## 2017-03-29 DIAGNOSIS — I10 Essential (primary) hypertension: Secondary | ICD-10-CM | POA: Diagnosis not present

## 2017-03-29 DIAGNOSIS — Z0001 Encounter for general adult medical examination with abnormal findings: Secondary | ICD-10-CM | POA: Diagnosis not present

## 2017-03-29 DIAGNOSIS — J438 Other emphysema: Secondary | ICD-10-CM | POA: Diagnosis not present

## 2017-03-29 DIAGNOSIS — N183 Chronic kidney disease, stage 3 (moderate): Secondary | ICD-10-CM | POA: Diagnosis not present

## 2017-04-06 DIAGNOSIS — I10 Essential (primary) hypertension: Secondary | ICD-10-CM | POA: Diagnosis not present

## 2017-04-06 DIAGNOSIS — Z1389 Encounter for screening for other disorder: Secondary | ICD-10-CM | POA: Diagnosis not present

## 2017-04-06 DIAGNOSIS — N183 Chronic kidney disease, stage 3 (moderate): Secondary | ICD-10-CM | POA: Diagnosis not present

## 2017-04-06 DIAGNOSIS — J438 Other emphysema: Secondary | ICD-10-CM | POA: Diagnosis not present

## 2017-04-06 DIAGNOSIS — E7800 Pure hypercholesterolemia, unspecified: Secondary | ICD-10-CM | POA: Diagnosis not present

## 2017-04-16 DIAGNOSIS — E7800 Pure hypercholesterolemia, unspecified: Secondary | ICD-10-CM | POA: Diagnosis not present

## 2017-04-16 DIAGNOSIS — D508 Other iron deficiency anemias: Secondary | ICD-10-CM | POA: Diagnosis not present

## 2017-04-16 DIAGNOSIS — I872 Venous insufficiency (chronic) (peripheral): Secondary | ICD-10-CM | POA: Diagnosis not present

## 2017-04-16 DIAGNOSIS — N183 Chronic kidney disease, stage 3 (moderate): Secondary | ICD-10-CM | POA: Diagnosis not present

## 2017-04-16 DIAGNOSIS — I10 Essential (primary) hypertension: Secondary | ICD-10-CM | POA: Diagnosis not present

## 2017-04-19 DIAGNOSIS — M1611 Unilateral primary osteoarthritis, right hip: Secondary | ICD-10-CM | POA: Diagnosis not present

## 2017-04-19 DIAGNOSIS — J438 Other emphysema: Secondary | ICD-10-CM | POA: Diagnosis not present

## 2017-04-19 DIAGNOSIS — E7800 Pure hypercholesterolemia, unspecified: Secondary | ICD-10-CM | POA: Diagnosis not present

## 2017-04-19 DIAGNOSIS — N183 Chronic kidney disease, stage 3 (moderate): Secondary | ICD-10-CM | POA: Diagnosis not present

## 2017-04-19 DIAGNOSIS — I10 Essential (primary) hypertension: Secondary | ICD-10-CM | POA: Diagnosis not present

## 2017-04-20 DIAGNOSIS — R590 Localized enlarged lymph nodes: Secondary | ICD-10-CM | POA: Diagnosis not present

## 2017-04-20 DIAGNOSIS — I77811 Abdominal aortic ectasia: Secondary | ICD-10-CM | POA: Diagnosis not present

## 2017-04-20 DIAGNOSIS — I700 Atherosclerosis of aorta: Secondary | ICD-10-CM | POA: Diagnosis not present

## 2017-04-30 DIAGNOSIS — C801 Malignant (primary) neoplasm, unspecified: Secondary | ICD-10-CM | POA: Diagnosis not present

## 2017-04-30 DIAGNOSIS — R590 Localized enlarged lymph nodes: Secondary | ICD-10-CM | POA: Diagnosis not present

## 2017-04-30 DIAGNOSIS — C774 Secondary and unspecified malignant neoplasm of inguinal and lower limb lymph nodes: Secondary | ICD-10-CM | POA: Diagnosis not present

## 2017-05-05 ENCOUNTER — Other Ambulatory Visit (HOSPITAL_COMMUNITY): Payer: Self-pay | Admitting: Radiation Oncology

## 2017-05-05 DIAGNOSIS — C609 Malignant neoplasm of penis, unspecified: Secondary | ICD-10-CM

## 2017-05-17 ENCOUNTER — Ambulatory Visit (HOSPITAL_COMMUNITY)
Admission: RE | Admit: 2017-05-17 | Discharge: 2017-05-17 | Disposition: A | Discharge status: Home / Self Care | Payer: Medicare Other | Source: Ambulatory Visit | Attending: Radiation Oncology | Admitting: Radiation Oncology

## 2017-05-17 DIAGNOSIS — C609 Malignant neoplasm of penis, unspecified: Secondary | ICD-10-CM | POA: Diagnosis not present

## 2017-05-17 DIAGNOSIS — I700 Atherosclerosis of aorta: Secondary | ICD-10-CM | POA: Insufficient documentation

## 2017-05-17 DIAGNOSIS — I2510 Atherosclerotic heart disease of native coronary artery without angina pectoris: Secondary | ICD-10-CM | POA: Diagnosis not present

## 2017-05-17 DIAGNOSIS — J432 Centrilobular emphysema: Secondary | ICD-10-CM | POA: Insufficient documentation

## 2017-05-17 DIAGNOSIS — C774 Secondary and unspecified malignant neoplasm of inguinal and lower limb lymph nodes: Secondary | ICD-10-CM | POA: Insufficient documentation

## 2017-05-17 LAB — GLUCOSE, CAPILLARY: Glucose-Capillary: 93 mg/dL (ref 65–99)

## 2017-05-17 MED ORDER — FLUDEOXYGLUCOSE F - 18 (FDG) INJECTION
12.4500 | Freq: Once | INTRAVENOUS | Status: AC | PRN
  Administered 2017-05-17: 12.45 via INTRAVENOUS

## 2017-05-20 DIAGNOSIS — Z8042 Family history of malignant neoplasm of prostate: Secondary | ICD-10-CM | POA: Diagnosis not present

## 2017-05-20 DIAGNOSIS — Z923 Personal history of irradiation: Secondary | ICD-10-CM | POA: Diagnosis not present

## 2017-05-20 DIAGNOSIS — E878 Other disorders of electrolyte and fluid balance, not elsewhere classified: Secondary | ICD-10-CM | POA: Diagnosis not present

## 2017-05-20 DIAGNOSIS — M1611 Unilateral primary osteoarthritis, right hip: Secondary | ICD-10-CM | POA: Diagnosis not present

## 2017-05-20 DIAGNOSIS — Z880 Allergy status to penicillin: Secondary | ICD-10-CM | POA: Diagnosis not present

## 2017-05-20 DIAGNOSIS — M170 Bilateral primary osteoarthritis of knee: Secondary | ICD-10-CM | POA: Diagnosis not present

## 2017-05-20 DIAGNOSIS — Z7951 Long term (current) use of inhaled steroids: Secondary | ICD-10-CM | POA: Diagnosis not present

## 2017-05-20 DIAGNOSIS — Z87891 Personal history of nicotine dependence: Secondary | ICD-10-CM | POA: Diagnosis not present

## 2017-05-20 DIAGNOSIS — I872 Venous insufficiency (chronic) (peripheral): Secondary | ICD-10-CM | POA: Diagnosis not present

## 2017-05-20 DIAGNOSIS — C774 Secondary and unspecified malignant neoplasm of inguinal and lower limb lymph nodes: Secondary | ICD-10-CM | POA: Diagnosis not present

## 2017-05-20 DIAGNOSIS — I10 Essential (primary) hypertension: Secondary | ICD-10-CM | POA: Diagnosis not present

## 2017-05-20 DIAGNOSIS — J449 Chronic obstructive pulmonary disease, unspecified: Secondary | ICD-10-CM | POA: Diagnosis not present

## 2017-05-20 DIAGNOSIS — I825Z2 Chronic embolism and thrombosis of unspecified deep veins of left distal lower extremity: Secondary | ICD-10-CM | POA: Diagnosis not present

## 2017-05-20 DIAGNOSIS — Z86718 Personal history of other venous thrombosis and embolism: Secondary | ICD-10-CM | POA: Diagnosis not present

## 2017-05-20 DIAGNOSIS — Z7982 Long term (current) use of aspirin: Secondary | ICD-10-CM | POA: Diagnosis not present

## 2017-05-20 DIAGNOSIS — Z96641 Presence of right artificial hip joint: Secondary | ICD-10-CM | POA: Diagnosis not present

## 2017-05-27 DIAGNOSIS — M1611 Unilateral primary osteoarthritis, right hip: Secondary | ICD-10-CM | POA: Diagnosis not present

## 2017-05-27 DIAGNOSIS — G479 Sleep disorder, unspecified: Secondary | ICD-10-CM | POA: Diagnosis not present

## 2017-05-31 DIAGNOSIS — C774 Secondary and unspecified malignant neoplasm of inguinal and lower limb lymph nodes: Secondary | ICD-10-CM | POA: Diagnosis not present

## 2017-06-16 DIAGNOSIS — Z7982 Long term (current) use of aspirin: Secondary | ICD-10-CM | POA: Diagnosis not present

## 2017-06-16 DIAGNOSIS — E785 Hyperlipidemia, unspecified: Secondary | ICD-10-CM | POA: Diagnosis not present

## 2017-06-16 DIAGNOSIS — I10 Essential (primary) hypertension: Secondary | ICD-10-CM | POA: Diagnosis not present

## 2017-06-16 DIAGNOSIS — Z87891 Personal history of nicotine dependence: Secondary | ICD-10-CM | POA: Diagnosis not present

## 2017-06-16 DIAGNOSIS — Z96641 Presence of right artificial hip joint: Secondary | ICD-10-CM | POA: Diagnosis not present

## 2017-06-16 DIAGNOSIS — N281 Cyst of kidney, acquired: Secondary | ICD-10-CM | POA: Diagnosis not present

## 2017-06-16 DIAGNOSIS — J449 Chronic obstructive pulmonary disease, unspecified: Secondary | ICD-10-CM | POA: Diagnosis not present

## 2017-06-16 DIAGNOSIS — C774 Secondary and unspecified malignant neoplasm of inguinal and lower limb lymph nodes: Secondary | ICD-10-CM | POA: Diagnosis not present

## 2017-06-16 DIAGNOSIS — Z880 Allergy status to penicillin: Secondary | ICD-10-CM | POA: Diagnosis not present

## 2017-06-16 DIAGNOSIS — H9190 Unspecified hearing loss, unspecified ear: Secondary | ICD-10-CM | POA: Diagnosis not present

## 2017-06-16 DIAGNOSIS — R102 Pelvic and perineal pain: Secondary | ICD-10-CM | POA: Diagnosis not present

## 2017-06-16 DIAGNOSIS — M47816 Spondylosis without myelopathy or radiculopathy, lumbar region: Secondary | ICD-10-CM | POA: Diagnosis not present

## 2017-06-16 DIAGNOSIS — I700 Atherosclerosis of aorta: Secondary | ICD-10-CM | POA: Diagnosis not present

## 2017-06-16 DIAGNOSIS — M47814 Spondylosis without myelopathy or radiculopathy, thoracic region: Secondary | ICD-10-CM | POA: Diagnosis not present

## 2017-06-16 DIAGNOSIS — Z86718 Personal history of other venous thrombosis and embolism: Secondary | ICD-10-CM | POA: Diagnosis not present

## 2017-06-16 DIAGNOSIS — J439 Emphysema, unspecified: Secondary | ICD-10-CM | POA: Diagnosis not present

## 2017-06-16 DIAGNOSIS — M5136 Other intervertebral disc degeneration, lumbar region: Secondary | ICD-10-CM | POA: Diagnosis not present

## 2017-06-16 DIAGNOSIS — J479 Bronchiectasis, uncomplicated: Secondary | ICD-10-CM | POA: Diagnosis not present

## 2017-06-16 DIAGNOSIS — I2510 Atherosclerotic heart disease of native coronary artery without angina pectoris: Secondary | ICD-10-CM | POA: Diagnosis not present

## 2017-06-16 DIAGNOSIS — M5137 Other intervertebral disc degeneration, lumbosacral region: Secondary | ICD-10-CM | POA: Diagnosis not present

## 2017-06-16 DIAGNOSIS — M1611 Unilateral primary osteoarthritis, right hip: Secondary | ICD-10-CM | POA: Diagnosis not present

## 2017-06-16 DIAGNOSIS — M5126 Other intervertebral disc displacement, lumbar region: Secondary | ICD-10-CM | POA: Diagnosis not present

## 2017-06-28 DIAGNOSIS — Z452 Encounter for adjustment and management of vascular access device: Secondary | ICD-10-CM | POA: Diagnosis not present

## 2017-06-28 DIAGNOSIS — Z800 Family history of malignant neoplasm of digestive organs: Secondary | ICD-10-CM | POA: Diagnosis not present

## 2017-06-28 DIAGNOSIS — I10 Essential (primary) hypertension: Secondary | ICD-10-CM | POA: Diagnosis not present

## 2017-06-28 DIAGNOSIS — Z8042 Family history of malignant neoplasm of prostate: Secondary | ICD-10-CM | POA: Diagnosis not present

## 2017-06-28 DIAGNOSIS — Z79899 Other long term (current) drug therapy: Secondary | ICD-10-CM | POA: Diagnosis not present

## 2017-06-28 DIAGNOSIS — Z86718 Personal history of other venous thrombosis and embolism: Secondary | ICD-10-CM | POA: Diagnosis not present

## 2017-06-28 DIAGNOSIS — Z880 Allergy status to penicillin: Secondary | ICD-10-CM | POA: Diagnosis not present

## 2017-06-28 DIAGNOSIS — Z4682 Encounter for fitting and adjustment of non-vascular catheter: Secondary | ICD-10-CM | POA: Diagnosis not present

## 2017-06-28 DIAGNOSIS — J439 Emphysema, unspecified: Secondary | ICD-10-CM | POA: Diagnosis not present

## 2017-06-28 DIAGNOSIS — Z7982 Long term (current) use of aspirin: Secondary | ICD-10-CM | POA: Diagnosis not present

## 2017-06-28 DIAGNOSIS — Z96641 Presence of right artificial hip joint: Secondary | ICD-10-CM | POA: Diagnosis not present

## 2017-06-28 DIAGNOSIS — M1611 Unilateral primary osteoarthritis, right hip: Secondary | ICD-10-CM | POA: Diagnosis not present

## 2017-06-28 DIAGNOSIS — Z87891 Personal history of nicotine dependence: Secondary | ICD-10-CM | POA: Diagnosis not present

## 2017-06-28 DIAGNOSIS — Z808 Family history of malignant neoplasm of other organs or systems: Secondary | ICD-10-CM | POA: Diagnosis not present

## 2017-06-28 DIAGNOSIS — E785 Hyperlipidemia, unspecified: Secondary | ICD-10-CM | POA: Diagnosis not present

## 2017-06-29 DIAGNOSIS — M47816 Spondylosis without myelopathy or radiculopathy, lumbar region: Secondary | ICD-10-CM | POA: Diagnosis not present

## 2017-06-29 DIAGNOSIS — I2510 Atherosclerotic heart disease of native coronary artery without angina pectoris: Secondary | ICD-10-CM | POA: Diagnosis not present

## 2017-06-29 DIAGNOSIS — M170 Bilateral primary osteoarthritis of knee: Secondary | ICD-10-CM | POA: Diagnosis not present

## 2017-06-29 DIAGNOSIS — Z96641 Presence of right artificial hip joint: Secondary | ICD-10-CM | POA: Diagnosis not present

## 2017-06-29 DIAGNOSIS — M5137 Other intervertebral disc degeneration, lumbosacral region: Secondary | ICD-10-CM | POA: Diagnosis not present

## 2017-06-29 DIAGNOSIS — R1900 Intra-abdominal and pelvic swelling, mass and lump, unspecified site: Secondary | ICD-10-CM | POA: Diagnosis not present

## 2017-06-29 DIAGNOSIS — Z7982 Long term (current) use of aspirin: Secondary | ICD-10-CM | POA: Diagnosis not present

## 2017-06-29 DIAGNOSIS — E871 Hypo-osmolality and hyponatremia: Secondary | ICD-10-CM | POA: Diagnosis not present

## 2017-06-29 DIAGNOSIS — Z880 Allergy status to penicillin: Secondary | ICD-10-CM | POA: Diagnosis not present

## 2017-06-29 DIAGNOSIS — M47814 Spondylosis without myelopathy or radiculopathy, thoracic region: Secondary | ICD-10-CM | POA: Diagnosis not present

## 2017-06-29 DIAGNOSIS — C774 Secondary and unspecified malignant neoplasm of inguinal and lower limb lymph nodes: Secondary | ICD-10-CM | POA: Diagnosis not present

## 2017-06-29 DIAGNOSIS — E785 Hyperlipidemia, unspecified: Secondary | ICD-10-CM | POA: Diagnosis not present

## 2017-06-29 DIAGNOSIS — Z7901 Long term (current) use of anticoagulants: Secondary | ICD-10-CM | POA: Diagnosis not present

## 2017-06-29 DIAGNOSIS — M1611 Unilateral primary osteoarthritis, right hip: Secondary | ICD-10-CM | POA: Diagnosis not present

## 2017-06-29 DIAGNOSIS — I700 Atherosclerosis of aorta: Secondary | ICD-10-CM | POA: Diagnosis not present

## 2017-06-29 DIAGNOSIS — N281 Cyst of kidney, acquired: Secondary | ICD-10-CM | POA: Diagnosis not present

## 2017-06-29 DIAGNOSIS — H9190 Unspecified hearing loss, unspecified ear: Secondary | ICD-10-CM | POA: Diagnosis not present

## 2017-06-29 DIAGNOSIS — I10 Essential (primary) hypertension: Secondary | ICD-10-CM | POA: Diagnosis not present

## 2017-06-29 DIAGNOSIS — Z923 Personal history of irradiation: Secondary | ICD-10-CM | POA: Diagnosis not present

## 2017-06-29 DIAGNOSIS — M5126 Other intervertebral disc displacement, lumbar region: Secondary | ICD-10-CM | POA: Diagnosis not present

## 2017-06-29 DIAGNOSIS — M5136 Other intervertebral disc degeneration, lumbar region: Secondary | ICD-10-CM | POA: Diagnosis not present

## 2017-06-29 DIAGNOSIS — Z86718 Personal history of other venous thrombosis and embolism: Secondary | ICD-10-CM | POA: Diagnosis not present

## 2017-06-29 DIAGNOSIS — J449 Chronic obstructive pulmonary disease, unspecified: Secondary | ICD-10-CM | POA: Diagnosis not present

## 2017-06-29 DIAGNOSIS — R102 Pelvic and perineal pain: Secondary | ICD-10-CM | POA: Diagnosis not present

## 2017-07-01 DIAGNOSIS — Z5111 Encounter for antineoplastic chemotherapy: Secondary | ICD-10-CM | POA: Diagnosis not present

## 2017-07-08 DIAGNOSIS — Z7982 Long term (current) use of aspirin: Secondary | ICD-10-CM | POA: Diagnosis not present

## 2017-07-08 DIAGNOSIS — N281 Cyst of kidney, acquired: Secondary | ICD-10-CM | POA: Diagnosis not present

## 2017-07-08 DIAGNOSIS — M5126 Other intervertebral disc displacement, lumbar region: Secondary | ICD-10-CM | POA: Diagnosis not present

## 2017-07-08 DIAGNOSIS — Z87891 Personal history of nicotine dependence: Secondary | ICD-10-CM | POA: Diagnosis not present

## 2017-07-08 DIAGNOSIS — Z923 Personal history of irradiation: Secondary | ICD-10-CM | POA: Diagnosis not present

## 2017-07-08 DIAGNOSIS — H9190 Unspecified hearing loss, unspecified ear: Secondary | ICD-10-CM | POA: Diagnosis not present

## 2017-07-08 DIAGNOSIS — C774 Secondary and unspecified malignant neoplasm of inguinal and lower limb lymph nodes: Secondary | ICD-10-CM | POA: Diagnosis not present

## 2017-07-08 DIAGNOSIS — Z86718 Personal history of other venous thrombosis and embolism: Secondary | ICD-10-CM | POA: Diagnosis not present

## 2017-07-08 DIAGNOSIS — R002 Palpitations: Secondary | ICD-10-CM | POA: Diagnosis not present

## 2017-07-08 DIAGNOSIS — R1900 Intra-abdominal and pelvic swelling, mass and lump, unspecified site: Secondary | ICD-10-CM | POA: Diagnosis not present

## 2017-07-08 DIAGNOSIS — E785 Hyperlipidemia, unspecified: Secondary | ICD-10-CM | POA: Diagnosis not present

## 2017-07-08 DIAGNOSIS — Z96641 Presence of right artificial hip joint: Secondary | ICD-10-CM | POA: Diagnosis not present

## 2017-07-08 DIAGNOSIS — I10 Essential (primary) hypertension: Secondary | ICD-10-CM | POA: Diagnosis not present

## 2017-07-08 DIAGNOSIS — I700 Atherosclerosis of aorta: Secondary | ICD-10-CM | POA: Diagnosis not present

## 2017-07-08 DIAGNOSIS — M47816 Spondylosis without myelopathy or radiculopathy, lumbar region: Secondary | ICD-10-CM | POA: Diagnosis not present

## 2017-07-08 DIAGNOSIS — J439 Emphysema, unspecified: Secondary | ICD-10-CM | POA: Diagnosis not present

## 2017-07-08 DIAGNOSIS — Q438 Other specified congenital malformations of intestine: Secondary | ICD-10-CM | POA: Diagnosis not present

## 2017-07-08 DIAGNOSIS — I2510 Atherosclerotic heart disease of native coronary artery without angina pectoris: Secondary | ICD-10-CM | POA: Diagnosis not present

## 2017-07-08 DIAGNOSIS — M5137 Other intervertebral disc degeneration, lumbosacral region: Secondary | ICD-10-CM | POA: Diagnosis not present

## 2017-07-08 DIAGNOSIS — M5136 Other intervertebral disc degeneration, lumbar region: Secondary | ICD-10-CM | POA: Diagnosis not present

## 2017-07-08 DIAGNOSIS — M47814 Spondylosis without myelopathy or radiculopathy, thoracic region: Secondary | ICD-10-CM | POA: Diagnosis not present

## 2017-07-08 DIAGNOSIS — M1611 Unilateral primary osteoarthritis, right hip: Secondary | ICD-10-CM | POA: Diagnosis not present

## 2017-07-08 DIAGNOSIS — J479 Bronchiectasis, uncomplicated: Secondary | ICD-10-CM | POA: Diagnosis not present

## 2017-07-09 DIAGNOSIS — Z5111 Encounter for antineoplastic chemotherapy: Secondary | ICD-10-CM | POA: Diagnosis not present

## 2017-07-09 DIAGNOSIS — Z79899 Other long term (current) drug therapy: Secondary | ICD-10-CM | POA: Diagnosis not present

## 2017-07-14 DIAGNOSIS — C774 Secondary and unspecified malignant neoplasm of inguinal and lower limb lymph nodes: Secondary | ICD-10-CM | POA: Diagnosis not present

## 2017-07-14 DIAGNOSIS — D701 Agranulocytosis secondary to cancer chemotherapy: Secondary | ICD-10-CM | POA: Diagnosis not present

## 2017-07-14 DIAGNOSIS — I700 Atherosclerosis of aorta: Secondary | ICD-10-CM | POA: Diagnosis not present

## 2017-07-14 DIAGNOSIS — R93421 Abnormal radiologic findings on diagnostic imaging of right kidney: Secondary | ICD-10-CM | POA: Diagnosis not present

## 2017-07-14 DIAGNOSIS — M5136 Other intervertebral disc degeneration, lumbar region: Secondary | ICD-10-CM | POA: Diagnosis not present

## 2017-07-14 DIAGNOSIS — R937 Abnormal findings on diagnostic imaging of other parts of musculoskeletal system: Secondary | ICD-10-CM | POA: Diagnosis not present

## 2017-07-14 DIAGNOSIS — M47814 Spondylosis without myelopathy or radiculopathy, thoracic region: Secondary | ICD-10-CM | POA: Diagnosis not present

## 2017-07-14 DIAGNOSIS — M170 Bilateral primary osteoarthritis of knee: Secondary | ICD-10-CM | POA: Diagnosis not present

## 2017-07-14 DIAGNOSIS — J439 Emphysema, unspecified: Secondary | ICD-10-CM | POA: Diagnosis not present

## 2017-07-14 DIAGNOSIS — M5137 Other intervertebral disc degeneration, lumbosacral region: Secondary | ICD-10-CM | POA: Diagnosis not present

## 2017-07-14 DIAGNOSIS — D709 Neutropenia, unspecified: Secondary | ICD-10-CM | POA: Diagnosis not present

## 2017-07-14 DIAGNOSIS — E871 Hypo-osmolality and hyponatremia: Secondary | ICD-10-CM | POA: Diagnosis not present

## 2017-07-14 DIAGNOSIS — M47816 Spondylosis without myelopathy or radiculopathy, lumbar region: Secondary | ICD-10-CM | POA: Diagnosis not present

## 2017-07-14 DIAGNOSIS — E878 Other disorders of electrolyte and fluid balance, not elsewhere classified: Secondary | ICD-10-CM | POA: Diagnosis not present

## 2017-07-14 DIAGNOSIS — M19011 Primary osteoarthritis, right shoulder: Secondary | ICD-10-CM | POA: Diagnosis not present

## 2017-07-14 DIAGNOSIS — I2510 Atherosclerotic heart disease of native coronary artery without angina pectoris: Secondary | ICD-10-CM | POA: Diagnosis not present

## 2017-07-14 DIAGNOSIS — T451X5A Adverse effect of antineoplastic and immunosuppressive drugs, initial encounter: Secondary | ICD-10-CM | POA: Diagnosis not present

## 2017-07-14 DIAGNOSIS — J479 Bronchiectasis, uncomplicated: Secondary | ICD-10-CM | POA: Diagnosis not present

## 2017-07-14 DIAGNOSIS — M160 Bilateral primary osteoarthritis of hip: Secondary | ICD-10-CM | POA: Diagnosis not present

## 2017-07-14 DIAGNOSIS — R948 Abnormal results of function studies of other organs and systems: Secondary | ICD-10-CM | POA: Diagnosis not present

## 2017-07-14 DIAGNOSIS — I10 Essential (primary) hypertension: Secondary | ICD-10-CM | POA: Diagnosis not present

## 2017-07-14 DIAGNOSIS — R5383 Other fatigue: Secondary | ICD-10-CM | POA: Diagnosis not present

## 2017-07-14 DIAGNOSIS — E785 Hyperlipidemia, unspecified: Secondary | ICD-10-CM | POA: Diagnosis not present

## 2017-07-20 ENCOUNTER — Emergency Department (HOSPITAL_COMMUNITY): Payer: Medicare Other

## 2017-07-20 ENCOUNTER — Inpatient Hospital Stay (HOSPITAL_COMMUNITY)
Admission: EM | Admit: 2017-07-20 | Discharge: 2017-07-23 | DRG: 640 | Disposition: A | Discharge status: Home Health | Payer: Medicare Other | Attending: Internal Medicine | Admitting: Internal Medicine

## 2017-07-20 ENCOUNTER — Encounter (HOSPITAL_COMMUNITY): Payer: Self-pay

## 2017-07-20 DIAGNOSIS — R531 Weakness: Secondary | ICD-10-CM | POA: Diagnosis not present

## 2017-07-20 DIAGNOSIS — D649 Anemia, unspecified: Secondary | ICD-10-CM | POA: Diagnosis not present

## 2017-07-20 DIAGNOSIS — Z79899 Other long term (current) drug therapy: Secondary | ICD-10-CM | POA: Diagnosis not present

## 2017-07-20 DIAGNOSIS — N400 Enlarged prostate without lower urinary tract symptoms: Secondary | ICD-10-CM | POA: Diagnosis present

## 2017-07-20 DIAGNOSIS — Z87891 Personal history of nicotine dependence: Secondary | ICD-10-CM

## 2017-07-20 DIAGNOSIS — C609 Malignant neoplasm of penis, unspecified: Secondary | ICD-10-CM | POA: Diagnosis present

## 2017-07-20 DIAGNOSIS — N183 Chronic kidney disease, stage 3 (moderate): Secondary | ICD-10-CM | POA: Diagnosis present

## 2017-07-20 DIAGNOSIS — R296 Repeated falls: Secondary | ICD-10-CM | POA: Diagnosis present

## 2017-07-20 DIAGNOSIS — Z880 Allergy status to penicillin: Secondary | ICD-10-CM

## 2017-07-20 DIAGNOSIS — H9190 Unspecified hearing loss, unspecified ear: Secondary | ICD-10-CM | POA: Diagnosis present

## 2017-07-20 DIAGNOSIS — N179 Acute kidney failure, unspecified: Secondary | ICD-10-CM | POA: Diagnosis not present

## 2017-07-20 DIAGNOSIS — I129 Hypertensive chronic kidney disease with stage 1 through stage 4 chronic kidney disease, or unspecified chronic kidney disease: Secondary | ICD-10-CM | POA: Diagnosis not present

## 2017-07-20 DIAGNOSIS — Z7952 Long term (current) use of systemic steroids: Secondary | ICD-10-CM

## 2017-07-20 DIAGNOSIS — Z7982 Long term (current) use of aspirin: Secondary | ICD-10-CM

## 2017-07-20 DIAGNOSIS — R402413 Glasgow coma scale score 13-15, at hospital admission: Secondary | ICD-10-CM | POA: Diagnosis not present

## 2017-07-20 DIAGNOSIS — T451X5A Adverse effect of antineoplastic and immunosuppressive drugs, initial encounter: Secondary | ICD-10-CM | POA: Diagnosis not present

## 2017-07-20 DIAGNOSIS — R404 Transient alteration of awareness: Secondary | ICD-10-CM | POA: Diagnosis not present

## 2017-07-20 DIAGNOSIS — T502X5A Adverse effect of carbonic-anhydrase inhibitors, benzothiadiazides and other diuretics, initial encounter: Secondary | ICD-10-CM | POA: Diagnosis present

## 2017-07-20 DIAGNOSIS — D61810 Antineoplastic chemotherapy induced pancytopenia: Secondary | ICD-10-CM | POA: Diagnosis not present

## 2017-07-20 DIAGNOSIS — E876 Hypokalemia: Secondary | ICD-10-CM

## 2017-07-20 DIAGNOSIS — Y92009 Unspecified place in unspecified non-institutional (private) residence as the place of occurrence of the external cause: Secondary | ICD-10-CM | POA: Diagnosis not present

## 2017-07-20 DIAGNOSIS — I10 Essential (primary) hypertension: Secondary | ICD-10-CM | POA: Diagnosis not present

## 2017-07-20 DIAGNOSIS — E861 Hypovolemia: Secondary | ICD-10-CM | POA: Diagnosis present

## 2017-07-20 DIAGNOSIS — R7989 Other specified abnormal findings of blood chemistry: Secondary | ICD-10-CM

## 2017-07-20 DIAGNOSIS — R778 Other specified abnormalities of plasma proteins: Secondary | ICD-10-CM

## 2017-07-20 DIAGNOSIS — D61818 Other pancytopenia: Secondary | ICD-10-CM | POA: Diagnosis not present

## 2017-07-20 DIAGNOSIS — E871 Hypo-osmolality and hyponatremia: Principal | ICD-10-CM | POA: Diagnosis present

## 2017-07-20 DIAGNOSIS — R269 Unspecified abnormalities of gait and mobility: Secondary | ICD-10-CM | POA: Diagnosis present

## 2017-07-20 DIAGNOSIS — W19XXXA Unspecified fall, initial encounter: Secondary | ICD-10-CM | POA: Diagnosis present

## 2017-07-20 DIAGNOSIS — J449 Chronic obstructive pulmonary disease, unspecified: Secondary | ICD-10-CM | POA: Diagnosis not present

## 2017-07-20 LAB — COMPREHENSIVE METABOLIC PANEL
ALBUMIN: 3.3 g/dL — AB (ref 3.5–5.0)
ALK PHOS: 63 U/L (ref 38–126)
ALT: 25 U/L (ref 17–63)
AST: 52 U/L — ABNORMAL HIGH (ref 15–41)
Anion gap: 15 (ref 5–15)
BUN: 25 mg/dL — ABNORMAL HIGH (ref 6–20)
CALCIUM: 8.3 mg/dL — AB (ref 8.9–10.3)
CO2: 24 mmol/L (ref 22–32)
CREATININE: 1.66 mg/dL — AB (ref 0.61–1.24)
Chloride: 72 mmol/L — ABNORMAL LOW (ref 101–111)
GFR calc Af Amer: 42 mL/min — ABNORMAL LOW (ref >60)
GFR calc non Af Amer: 37 mL/min — ABNORMAL LOW (ref >60)
GLUCOSE: 102 mg/dL — AB (ref 65–99)
Potassium: 2.1 mmol/L — CL (ref 3.5–5.1)
SODIUM: 111 mmol/L — AB (ref 135–145)
Total Bilirubin: 0.9 mg/dL (ref 0.3–1.2)
Total Protein: 5.8 g/dL — ABNORMAL LOW (ref 6.5–8.1)

## 2017-07-20 LAB — URINALYSIS, ROUTINE W REFLEX MICROSCOPIC
BACTERIA UA: NONE SEEN
BILIRUBIN URINE: NEGATIVE
Glucose, UA: NEGATIVE mg/dL
Hgb urine dipstick: NEGATIVE
Ketones, ur: NEGATIVE mg/dL
NITRITE: NEGATIVE
Protein, ur: NEGATIVE mg/dL
SPECIFIC GRAVITY, URINE: 1.006 (ref 1.005–1.030)
Squamous Epithelial / LPF: NONE SEEN
pH: 7 (ref 5.0–8.0)

## 2017-07-20 LAB — I-STAT CG4 LACTIC ACID, ED
Lactic Acid, Venous: 0.82 mmol/L (ref 0.5–1.9)
Lactic Acid, Venous: 1.51 mmol/L (ref 0.5–1.9)

## 2017-07-20 LAB — BASIC METABOLIC PANEL
Anion gap: 13 (ref 5–15)
BUN: 24 mg/dL — AB (ref 6–20)
CHLORIDE: 76 mmol/L — AB (ref 101–111)
CO2: 25 mmol/L (ref 22–32)
CREATININE: 1.63 mg/dL — AB (ref 0.61–1.24)
Calcium: 8.2 mg/dL — ABNORMAL LOW (ref 8.9–10.3)
GFR calc Af Amer: 43 mL/min — ABNORMAL LOW (ref >60)
GFR calc non Af Amer: 37 mL/min — ABNORMAL LOW (ref >60)
GLUCOSE: 112 mg/dL — AB (ref 65–99)
Potassium: 3.1 mmol/L — ABNORMAL LOW (ref 3.5–5.1)
SODIUM: 114 mmol/L — AB (ref 135–145)

## 2017-07-20 LAB — CBC WITH DIFFERENTIAL/PLATELET
BASOS PCT: 0 %
Basophils Absolute: 0 10*3/uL (ref 0.0–0.1)
EOS ABS: 0 10*3/uL (ref 0.0–0.7)
EOS PCT: 1 %
HCT: 25.1 % — ABNORMAL LOW (ref 39.0–52.0)
Hemoglobin: 9.3 g/dL — ABNORMAL LOW (ref 13.0–17.0)
Lymphocytes Relative: 12 %
Lymphs Abs: 0.2 10*3/uL — ABNORMAL LOW (ref 0.7–4.0)
MCH: 30.9 pg (ref 26.0–34.0)
MCHC: 37.1 g/dL — AB (ref 30.0–36.0)
MCV: 83.4 fL (ref 78.0–100.0)
MONO ABS: 0.5 10*3/uL (ref 0.1–1.0)
MONOS PCT: 28 %
Neutro Abs: 1 10*3/uL — ABNORMAL LOW (ref 1.7–7.7)
Neutrophils Relative %: 59 %
Platelets: 73 10*3/uL — ABNORMAL LOW (ref 150–400)
RBC: 3.01 MIL/uL — ABNORMAL LOW (ref 4.22–5.81)
RDW: 12.8 % (ref 11.5–15.5)
WBC: 1.7 10*3/uL — ABNORMAL LOW (ref 4.0–10.5)

## 2017-07-20 LAB — SODIUM, URINE, RANDOM: Sodium, Ur: 56 mmol/L

## 2017-07-20 LAB — TYPE AND SCREEN
ABO/RH(D): A POS
ANTIBODY SCREEN: NEGATIVE

## 2017-07-20 LAB — TROPONIN I: TROPONIN I: 0.04 ng/mL — AB (ref <0.03)

## 2017-07-20 LAB — TSH: TSH: 3.794 u[IU]/mL (ref 0.350–4.500)

## 2017-07-20 LAB — MAGNESIUM: MAGNESIUM: 1.7 mg/dL (ref 1.7–2.4)

## 2017-07-20 MED ORDER — SODIUM CHLORIDE 0.9 % IV BOLUS (SEPSIS)
1000.0000 mL | Freq: Once | INTRAVENOUS | Status: AC
  Administered 2017-07-20: 1000 mL via INTRAVENOUS

## 2017-07-20 MED ORDER — MAGNESIUM SULFATE 2 GM/50ML IV SOLN
2.0000 g | Freq: Once | INTRAVENOUS | Status: AC
  Administered 2017-07-20: 2 g via INTRAVENOUS
  Filled 2017-07-20: qty 50

## 2017-07-20 MED ORDER — POTASSIUM CHLORIDE CRYS ER 20 MEQ PO TBCR
40.0000 meq | EXTENDED_RELEASE_TABLET | Freq: Two times a day (BID) | ORAL | Status: DC
  Administered 2017-07-20: 40 meq via ORAL
  Filled 2017-07-20: qty 2

## 2017-07-20 MED ORDER — TAMSULOSIN HCL 0.4 MG PO CAPS
0.4000 mg | ORAL_CAPSULE | Freq: Every day | ORAL | Status: DC
  Administered 2017-07-21 – 2017-07-22 (×2): 0.4 mg via ORAL
  Filled 2017-07-20 (×2): qty 1

## 2017-07-20 MED ORDER — POTASSIUM CHLORIDE 10 MEQ/100ML IV SOLN
10.0000 meq | INTRAVENOUS | Status: AC
  Administered 2017-07-20 (×3): 10 meq via INTRAVENOUS
  Filled 2017-07-20 (×3): qty 100

## 2017-07-20 MED ORDER — ACETAMINOPHEN 650 MG RE SUPP: 650.0000 mg | Freq: Four times a day (QID) | RECTAL | Status: DC | PRN

## 2017-07-20 MED ORDER — POTASSIUM CHLORIDE CRYS ER 20 MEQ PO TBCR
40.0000 meq | EXTENDED_RELEASE_TABLET | Freq: Once | ORAL | Status: AC
  Administered 2017-07-20: 40 meq via ORAL
  Filled 2017-07-20: qty 2

## 2017-07-20 MED ORDER — SODIUM CHLORIDE 3 % IV SOLN
INTRAVENOUS | Status: DC
  Administered 2017-07-20: 40 mL/h via INTRAVENOUS
  Filled 2017-07-20 (×4): qty 500

## 2017-07-20 MED ORDER — ACETAMINOPHEN 325 MG PO TABS: 650.0000 mg | ORAL_TABLET | Freq: Four times a day (QID) | ORAL | Status: DC | PRN

## 2017-07-20 NOTE — ED Triage Notes (Signed)
Pt has received chemo twice for penile cancer and is c/o generalized weakness.

## 2017-07-20 NOTE — ED Notes (Signed)
ED Provider at bedside. 

## 2017-07-20 NOTE — ED Provider Notes (Signed)
Emergency Department Provider Note   I have reviewed the triage vital signs and the nursing notes.   HISTORY  Chief Complaint Weakness   HPI Christian Sherman is a 84 y.o. male history of COPD, emphysema, hyperlipidemia penile cancer that recently a couple chemo treatments.  He is post get his third chemo treatment on Friday but he went in his white blood cell count was too low so they started him on Levaquin and send him home.  Saturday patient started to weakness and fell twice.  He did hit his head once.  Has not been eating normally but has been drinking fluids.  Since Saturday is not gotten out of bed because of weakness and his daughter will not let him.  As far she knows no fevers or chills.  He has a history of emphysema but no cough and or shortness of breath is worse than his normal.  No change in his urine habits or urine color. No other associated or modifying symptoms.    Past Medical History:  Diagnosis Date  . COPD (chronic obstructive pulmonary disease) ([place])   . Emphysema lung ([place])   . Hard of hearing   . Hypercholesteremia   . Hypertension   . Penile cancer [facility])     Patient Active Problem List   Diagnosis Date Noted  . Hyponatremia 07/20/2017  . Hypokalemia 07/20/2017  . COPD (chronic obstructive pulmonary disease) ([place]) 07/20/2017  . Essential hypertension 07/20/2017  . Pancytopenia ([place]) 07/20/2017  . Penile cancer ([place]) 07/31/2016    Past Surgical History:  Procedure Laterality Date  . CATARACT EXTRACTION Bilateral   . HIP ARTHROPLASTY Right   . LYMPH NODE BIOPSY Right    right groin lymphnode biopsy  . PENECTOMY     partial    Current Outpatient Rx  . Order #: [id number] Class: Historical Med  . Order #: [id number] Class: Historical Med  . Order #: [id number] Class: Historical Med  . Order #: [id number] Class: Historical Med  . Order #: [id number] Class: Historical Med  . Order #: [id number] Class: Historical Med  . Order #: [id number] Class:  Historical Med  . Order #: [id number] Class: Historical Med  . Order #: [id number] Class: Historical Med  . Order #: [id number] Class: Historical Med  . Order #: [id number] Class: Historical Med  . Order #: [id number] Class: Historical Med  . Order #: [id number] Class: Historical Med  . Order #: [id number] Class: Historical Med  . Order #: [id number] Class: Historical Med    Allergies Penicillin g  Family History  Problem Relation Age of Onset  . Cancer Mother   . Heart disease Father   . Cancer Sister   . Stomach cancer Brother   . Pancreatic cancer Brother   . Heart disease Sister   . Heart disease Sister     Social History Social History   Tobacco Use  . Smoking status: Former Smoker    Types: Cigarettes    Last attempt to quit: 07/13/1965    Years since quitting: 52.0  . Smokeless tobacco: Never Used  Substance Use Topics  . Alcohol use: No  . Drug use: No    Review of Systems  All other systems negative except as documented in the HPI. All pertinent positives and negatives as reviewed in the HPI. ____________________________________________   PHYSICAL EXAM:  VITAL SIGNS: ED Triage Vitals  Enc Vitals Group     BP 07/20/17 1348 140/72     Pulse Rate 07/20/17 1348 83     Resp --      Temp  07/20/17 1348 98.6 F (37 C)     Temp Source 07/20/17 1348 Oral     SpO2 07/20/17 1348 99 %     Weight 07/20/17 1349 230 lb (104.3 kg)     Height 07/20/17 1349 5\' 9"  (1.753 m)    Constitutional: Alert and oriented. Well appearing and in no acute distress. Eyes: Conjunctivae are normal. PERRL. EOMI. Head: Atraumatic. Nose: No congestion/rhinnorhea. Mouth/Throat: Mucous membranes are moist.  Oropharynx non-erythematous. Neck: No stridor.  No meningeal signs.   Cardiovascular: Normal rate, regular rhythm. Good peripheral circulation. Grossly normal heart sounds.   Respiratory: Normal respiratory effort.  No retractions. Lungs with minimal diffuse end expiratory  wheezing. Gastrointestinal: Soft and nontender. No distention.  Musculoskeletal: No lower extremity tenderness nor edema. No gross deformities of extremities. Neurologic:  Normal speech and language. No gross focal neurologic deficits are appreciated.  Skin:  Skin is warm, dry and intact. No rash noted. Mild pallor.    ____________________________________________   LABS (all labs ordered are listed, but only abnormal results are displayed)  Labs Reviewed  COMPREHENSIVE METABOLIC PANEL - Abnormal; Notable for the following components:      Result Value   Sodium 111 (*)    Potassium 2.1 (*)    Chloride 72 (*)    Glucose, Bld 102 (*)    BUN 25 (*)    Creatinine, Ser 1.66 (*)    Calcium 8.3 (*)    Total Protein 5.8 (*)    Albumin 3.3 (*)    AST 52 (*)    GFR calc non Af Amer 37 (*)    GFR calc Af Amer 42 (*)    All other components within normal limits  CBC WITH DIFFERENTIAL/PLATELET - Abnormal; Notable for the following components:   WBC 1.7 (*)    RBC 3.01 (*)    Hemoglobin 9.3 (*)    HCT 25.1 (*)    MCHC 37.1 (*)    Platelets 73 (*)    Neutro Abs 1.0 (*)    Lymphs Abs 0.2 (*)    All other components within normal limits  URINALYSIS, ROUTINE W REFLEX MICROSCOPIC - Abnormal; Notable for the following components:   Leukocytes, UA TRACE (*)    All other components within normal limits  TROPONIN I - Abnormal; Notable for the following components:   Troponin I 0.04 (*)    All other components within normal limits  BASIC METABOLIC PANEL - Abnormal; Notable for the following components:   Sodium 114 (*)    Potassium 3.1 (*)    Chloride 76 (*)    Glucose, Bld 112 (*)    BUN 24 (*)    Creatinine, Ser 1.63 (*)    Calcium 8.2 (*)    GFR calc non Af Amer 37 (*)    GFR calc Af Amer 43 (*)    All other components within normal limits  CULTURE, BLOOD (ROUTINE X 2)  CULTURE, BLOOD (ROUTINE X 2)  MAGNESIUM  SODIUM, URINE, RANDOM  TSH  BASIC METABOLIC PANEL  BASIC METABOLIC  PANEL  BASIC METABOLIC PANEL  OSMOLALITY, URINE  TROPONIN I  TROPONIN I  OSMOLALITY  I-STAT CG4 LACTIC ACID, ED  I-STAT CG4 LACTIC ACID, ED  TYPE AND SCREEN   ____________________________________________  EKG   EKG Interpretation  Date/Time:  Tuesday July 20 2017 15:21:07 EST Ventricular Rate:  73 PR Interval:    QRS Duration: 162 QT Interval:  441 QTC Calculation: 486 R Axis:   -63 Text Interpretation:  Sinus rhythm Multiple premature complexes, vent & supraven Prolonged PR interval Nonspecific IVCD with LAD Left ventricular hypertrophy No old tracing to compare Confirmed by Merrily Pew [phone]) on 07/20/2017 3:58:16 PM       ____________________________________________  RADIOLOGY  Dg Chest 2 View  Result Date: 07/20/2017 CLINICAL DATA:  Weakness EXAM: CHEST  2 VIEW COMPARISON:  06/28/2017 FINDINGS: Mild linear scarring/atelectasis at the right lung base. Left lung is clear. No pleural effusion or pneumothorax. The heart is normal in size. Right IJ chest port terminates in the lower SVC. Degenerative changes of the visualized thoracolumbar spine. IMPRESSION: No evidence of acute cardiopulmonary disease. Electronically Signed   By: Julian Hy M.D.   On: 07/20/2017 15:56   Ct Head Wo Contrast  Result Date: 07/20/2017 CLINICAL DATA:  Generalized weakness for 1 week.  No known injury. EXAM: CT HEAD WITHOUT CONTRAST TECHNIQUE: Contiguous axial images were obtained from the base of the skull through the vertex without intravenous contrast. COMPARISON:  None. FINDINGS: Brain: Atrophy and chronic microvascular ischemic change are identified. No evidence of acute abnormality including hemorrhage, infarct, mass lesion, mass effect, midline shift or abnormal extra-axial fluid collection. Vascular: Extensive atherosclerosis.  Otherwise unremarkable. Skull: Intact. Sinuses/Orbits: Negative. Other: None. IMPRESSION: No acute abnormality. Atrophy and chronic microvascular ischemic  change. Atherosclerosis. Electronically Signed   By: Inge Rise M.D.   On: 07/20/2017 16:11    ____________________________________________   PROCEDURES  Procedure(s) performed:   Procedures  CRITICAL CARE Performed by: Merrily Pew Total critical care time: 40 minutes Critical care time was exclusive of separately billable procedures and treating other patients. Critical care was necessary to treat or prevent imminent or life-threatening deterioration. Critical care was time spent personally by me on the following activities: development of treatment plan with patient and/or surrogate as well as nursing, discussions with consultants, evaluation of patient's response to treatment, examination of patient, obtaining history from patient or surrogate, ordering and performing treatments and interventions, ordering and review of laboratory studies, ordering and review of radiographic studies, pulse oximetry and re-evaluation of patient's condition.  ____________________________________________   INITIAL IMPRESSION / ASSESSMENT AND PLAN / ED COURSE  Unsure cause symptoms at this time.  Possible dehydration.  Will evaluate for any evidence of abdominal dysfunction, infection, head bleed.  To have severe hyponatremia, hypokalemia mildly elevated troponin.  EKG with some wide QRS but nothing previous to compare to.  Started give a couple liters of fluid, magnesium and potassium.  Aspirin given.  Feel like the elevated troponin is likely related to his kidney disease not ACS at this time.  Initially try to admit to the hospitalist however the patient wanted to be sent to [facility].  I called [facility] and they felt the patient was best managed here so was admitted to the hospitalist ICU.  Pertinent labs & imaging results that were available during my care of the patient were reviewed by me and considered in my medical decision making (see chart for  details).  ____________________________________________  FINAL CLINICAL IMPRESSION(S) / ED DIAGNOSES  Final diagnoses:  Weakness  Hyponatremia  Hypokalemia  AKI (acute kidney injury) ([place])  Elevated troponin  Anemia, unspecified type     MEDICATIONS GIVEN DURING THIS VISIT:  Medications  potassium chloride 10 mEq in 100 mL IVPB (10 mEq Intravenous Transfusing/Transfer 07/20/17 2131)  sodium chloride (hypertonic) 3 % solution (40 mL/hr Intravenous Transfusing/Transfer 07/20/17 2131)  potassium chloride SA (K-DUR,KLOR-CON) CR tablet 40 mEq (not administered)  sodium chloride 0.9 % bolus  1,000 mL (0 mLs Intravenous Stopped 07/20/17 1809)  sodium chloride 0.9 % bolus 1,000 mL (0 mLs Intravenous Stopped 07/20/17 2044)  magnesium sulfate IVPB 2 g 50 mL (0 g Intravenous Stopped 07/20/17 1954)  potassium chloride SA (K-DUR,KLOR-CON) CR tablet 40 mEq (40 mEq Oral Given 07/20/17 1900)     NEW OUTPATIENT MEDICATIONS STARTED DURING THIS VISIT:  This SmartLink is deprecated. Use AVSMEDLIST instead to display the medication list for a patient.  Note:  This note was prepared with assistance of Dragon voice recognition software. Occasional wrong-word or sound-a-like substitutions may have occurred due to the inherent limitations of voice recognition software.   Merrily Pew, MD 07/20/17 2145

## 2017-07-20 NOTE — ED Notes (Signed)
CRITICAL VALUE ALERT  Critical Value:  Potassium 2.1 Troponin 0.04 Na 111  Date & Time Notied:  07-20-17 1814  Provider Notified: Notified MD   Orders Received/Actions taken: Notified MD

## 2017-07-20 NOTE — ED Notes (Signed)
CRITICAL VALUE ALERT  Critical Value:  Sodium 114   Date & Time Notied:  07/20/2017 at 2125  Provider Notified: [place] EDP  Orders Received/Actions taken: No orders at this time

## 2017-07-20 NOTE — H&P (Addendum)
History and Physical    Christian Sherman IRC:[id number] DOB: 02/27/1932 DOA: 07/20/2017  PCP: Manon Hilding, MD   Patient coming from: Home  Chief Complaint: Weakness, falls.  HPI: Christian Sherman is a 84 y.o. male with medical history significant for penile ca- actively undergoing treatment, COPD, HTN, who presented to the from home with complaints of generalized weakness of 3 days duration, lightheadedness, could not move his legs and several falls over the past 3 days.  Reports hitting his head when he fell at least once.  Patient lives with his daughter.  Daughter-[name], reports poor appetite for 3 weeks since he started chemotherapy paclitaxel for his penile cancer.  Reports over the past 3 days patient has barely eaten anything, but has been drinking at least 4-5  16 ounce bottles of water daily, times more.  Would sometimes drink Gatorade or Sprite.  Patient has been taking all his prescribed medications which include Lasix 20 mg every day, and lisinopril with HCTZ 20/25 mg daily.  Patient denies nausea vomiting headache or seizures.  Redness of breath no leg swelling.   Daughter reports patient was started on Levaquin for low white blood count.  Daughter says patient has had low sodium in the past, andwas told to increase salt in his diet.  Patient did not receive last dose of chemo, as his WBC was too low.  ED Course: Stable vitals, blood work with multiple abnormalities- Na markedly low at 111, potassium 2.1, creatinine elevated at 1.6 minimally elevated troponin 0 0.04, WBC low at 1.7 , platelets low at 73 absolute neutrophil counts 1000, hemoglobin low at 9.3 , UA with trace leukocytes.  EKG with prolonged QRS at 162, no old EKG to compare.  Patient's daughter initially requested to be transferred to [facility] where he is oncologist is, later changed their minds.  Patient was given 2 L normal saline bolus in the ED, IV and p.o. Potassium , 2 mg magnesium sulfate. EDP  provider at [facility] and was told patient's sodium was 130 at the middle of December. Hospitalist  Was called to admit for hyponatremia.  Review of Systems:  CONSTITUTIONAL- No Fever, weightloss, night sweat, but endorses poor appetite SKIN- No Rash, colour changes or itching. HEAD- No Headache or dizziness. Mouth/throat- No Sorethroat, dentures, or bleeding gums. RESPIRATORY- No Cough or SOB. CARDIAC- No Palpitations,  or chest pain. GI- No nausea, vomiting, diarrhoea, constipation, abd pain. URINARY- No Frequency, urgency, straining or dysuria. NEUROLOGIC- No Numbness, syncope, seizures or burning.  Past Medical History:  Diagnosis Date  . COPD (chronic obstructive pulmonary disease) ([place])   . Emphysema lung ([place])   . Hard of hearing   . Hypercholesteremia   . Hypertension   . Penile cancer [facility])     Past Surgical History:  Procedure Laterality Date  . CATARACT EXTRACTION Bilateral   . HIP ARTHROPLASTY Right   . LYMPH NODE BIOPSY Right    right groin lymphnode biopsy  . PENECTOMY     partial    reports that he quit smoking about 52 years ago. His smoking use included cigarettes. he has never used smokeless tobacco. He reports that he does not drink alcohol or use drugs.  Allergies  Allergen Reactions  . Penicillin G Swelling    Arm swelling noted per pt Has patient had a PCN reaction causing immediate rash, facial/tongue/throat swelling, SOB or lightheadedness with hypotension: no Has patient had a PCN reaction causing severe rash involving mucus membranes or skin necrosis:  No Has patient had a PCN reaction that required hospitalization: No Has patient had a PCN reaction occurring within the last 10 years: No If all of the above answers are "NO", then may proceed with Cephalosporin use.     Family History  Problem Relation Age of Onset  . Cancer Mother   . Heart disease Father   . Cancer Sister   . Stomach cancer Brother   . Pancreatic cancer Brother   . Heart  disease Sister   . Heart disease Sister    Prior to Admission medications   Medication Sig Start Date End Date Taking? Authorizing Provider  acetaminophen (TYLENOL) 650 MG CR tablet Take 650 mg by mouth daily as needed for pain.    Yes [provider]  albuterol (PROVENTIL HFA;VENTOLIN HFA) 108 (90 Base) MCG/ACT inhaler Inhale into the lungs.   Yes [provider]  aspirin EC 81 MG tablet Take 81 mg by mouth at bedtime.    Yes [provider]  bacitracin 500 UNIT/GM ointment Apply 1 application topically 2 (two) times daily as needed for wound care.  08/08/16  Yes [provider]  bisacodyl (BISACODYL) 5 MG EC tablet Take 5 mg by mouth daily as needed for mild constipation or moderate constipation.   Yes [provider]  Cholecalciferol (VITAMIN D3) 1000 units CAPS Take 1 capsule by mouth daily.   Yes [provider]  dexamethasone (DECADRON) 4 MG tablet Take 8 mg by mouth See admin instructions. Take 2 tablets the night before chemo (paclitaxel).   Yes [provider]  furosemide (LASIX) 20 MG tablet Take 20 mg by mouth daily.   Yes [provider]  levofloxacin (LEVAQUIN) 500 MG tablet Take 1 tablet by mouth daily. 07/14/17  Yes [provider]  lisinopril-hydrochlorothiazide (PRINZIDE,ZESTORETIC) 20-25 MG tablet Take by mouth. 05/30/16  Yes [provider]  Melatonin 3 MG TABS Take 6 mg by mouth at bedtime.    Yes [provider]  multivitamin (ONE-A-DAY MEN'S) TABS tablet Take by mouth.   Yes [provider]  ondansetron (ZOFRAN) 8 MG tablet Take 8 mg by mouth every 12 (twelve) hours as needed for nausea/vomiting. 06/16/17 06/16/18 Yes [provider]  Red Yeast Rice 600 MG TABS Take 1,800 mg by mouth daily.    Yes [provider]  tamsulosin (FLOMAX) 0.4 MG CAPS capsule Take 0.4 mg by mouth. 05/25/16  Yes [provider]    Physical Exam: Vitals:   07/20/17  1849 07/20/17 1900 07/20/17 1930 07/20/17 2000  BP: (!) 148/64 114/74 129/60 (!) 126/59  Pulse: 71 75 67 65  Resp: 18 20 16 15   Temp:      TempSrc:      SpO2: 97% 97% 99% 100%  Weight:      Height:        Constitutional: NAD, calm, comfortable, pleasant gentleman Vitals:   07/20/17 1849 07/20/17 1900 07/20/17 1930 07/20/17 2000  BP: (!) 148/64 114/74 129/60 (!) 126/59  Pulse: 71 75 67 65  Resp: 18 20 16 15   Temp:      TempSrc:      SpO2: 97% 97% 99% 100%  Weight:      Height:       Eyes: PERRL, lids and conjunctivae normal ENMT: Mucous membranes are moist. Neck: normal, supple, no masses, no thyromegaly Respiratory: Mild bilateral expiratory wheezing, no crackles. Normal respiratory effort. No accessory muscle use.  Cardiovascular: Regular rate and rhythm, no murmurs / rubs /  gallops. No extremity edema. 2+ pedal pulses. No carotid bruits.  Abdomen: no tenderness, no masses palpated. No hepatosplenomegaly. Bowel sounds positive.  Musculoskeletal: no clubbing / cyanosis. No joint deformity upper and lower extremities. Good ROM, no contractures. Normal muscle tone.  Skin: no rashes, lesions, ulcers. No induration Neurologic: CN 2-12 grossly intact. Strength 5/5 in all 4.  Psychiatric: Normal judgment and insight. Alert and oriented x 3. Normal mood.   Labs on Admission: I have personally reviewed following labs and imaging studies  CBC: Recent Labs  Lab 07/20/17 1705  WBC 1.7*  NEUTROABS 1.0*  HGB 9.3*  HCT 25.1*  MCV 83.4  PLT 73*   Basic Metabolic Panel: Recent Labs  Lab 07/20/17 1705 07/20/17 1714  NA 111*  --   K 2.1*  --   CL 72*  --   CO2 24  --   GLUCOSE 102*  --   BUN 25*  --   CREATININE 1.66*  --   CALCIUM 8.3*  --   MG  --  1.7   GFR: Estimated Creatinine Clearance: 40.1 mL/min (A) (by C-G formula based on SCr of 1.66 mg/dL (H)). Liver Function Tests: Recent Labs  Lab 07/20/17 1705  AST 52*  ALT 25  ALKPHOS 63  BILITOT 0.9  PROT 5.8*    ALBUMIN 3.3*   Cardiac Enzymes: Recent Labs  Lab 07/20/17 1705  TROPONINI 0.04*   Urine analysis:    Component Value Date/Time   COLORURINE YELLOW 07/20/2017 [place] 07/20/2017 1505   LABSPEC 1.006 07/20/2017 1505   PHURINE 7.0 07/20/2017 1505   GLUCOSEU NEGATIVE 07/20/2017 1505   HGBUR NEGATIVE 07/20/2017 1505   BILIRUBINUR NEGATIVE 07/20/2017 1505   KETONESUR NEGATIVE 07/20/2017 1505   PROTEINUR NEGATIVE 07/20/2017 1505   NITRITE NEGATIVE 07/20/2017 1505   LEUKOCYTESUR TRACE (A) 07/20/2017 1505    Radiological Exams on Admission: Dg Chest 2 View  Result Date: 07/20/2017 CLINICAL DATA:  Weakness EXAM: CHEST  2 VIEW COMPARISON:  06/28/2017 FINDINGS: Mild linear scarring/atelectasis at the right lung base. Left lung is clear. No pleural effusion or pneumothorax. The heart is normal in size. Right IJ chest port terminates in the lower SVC. Degenerative changes of the visualized thoracolumbar spine. IMPRESSION: No evidence of acute cardiopulmonary disease. Electronically Signed   By: Julian Hy M.D.   On: 07/20/2017 15:56   Ct Head Wo Contrast  Result Date: 07/20/2017 CLINICAL DATA:  Generalized weakness for 1 week.  No known injury. EXAM: CT HEAD WITHOUT CONTRAST TECHNIQUE: Contiguous axial images were obtained from the base of the skull through the vertex without intravenous contrast. COMPARISON:  None. FINDINGS: Brain: Atrophy and chronic microvascular ischemic change are identified. No evidence of acute abnormality including hemorrhage, infarct, mass lesion, mass effect, midline shift or abnormal extra-axial fluid collection. Vascular: Extensive atherosclerosis.  Otherwise unremarkable. Skull: Intact. Sinuses/Orbits: Negative. Other: None. IMPRESSION: No acute abnormality. Atrophy and chronic microvascular ischemic change. Atherosclerosis. Electronically Signed   By: Inge Rise M.D.   On: 07/20/2017 16:11    EKG: Independently reviewed.  Prolonged  QRS- 162,  multiple premature ventricular complexes.  Assessment/Plan Principal Problem:   Hyponatremia Active Problems:   Penile cancer ([facility])   Hypokalemia   COPD (chronic obstructive pulmonary disease) ([facility])   Essential hypertension   Pancytopenia ([facility])   Severe hyponatremia- Na- 111.  Likely multifactorial.  Poor p.o. Intake, polydipsia, and ongoing diuretic use-Lasix -20mg  and HCTZ- 25mg .  Appears mildly-moderately symptomatic with dizziness multiple falls likely  from gait disturbances.  Head CT negative.  2 L normal saline given in ED. Na recheck 114. -3% saline X12 hrs -Serum osmolality -Urine osmolality -TSh -Urine sodium -BMP every 3 hourly  -Goal sodium increase 10- 12 in 24 hours -Seizure precautions - NPO for now, allow meds and sips, in case of seizures - Nephrology consult- order placed - D/c 3% when Na is 120  - Addendum- Na- 111>>116>115, increase 3% to 50cc/hr. Will sign out to on call provider.  Severe hypokalemia-2.1 with hyponatremia, concurrent electrolyte abnormality suggest diuretic use as etiology. Mag- 1.7.  2g mag given in ED -IV KCL 10 X 3 started in ED - 40 PO KCL X3  AKI-creatinine 1.6.  Last creatinine in our system 1.2.  Likely from poor p.o. intake and ongoing diuretic use. -Hydrate - bmp recheck  COPD-wheezing only, no cough or SOB -Hold off on steroids - Duonebs  Hypertension-blood pressure stable. -Hold antihypertensives HCTZ considering severe hyponatremia -Hold lisinopril-20mg  will mild AKI  Pancytopenia-WBC 1.7 , Abs neutrophil count- 1000, hemoglobin 9.3, platelets 73.  Likely from chemotherapy.  Patient was given Levaquin for low WBC.  Chest x-ray ,UA not suggesting infection -Hold off on antibiotics for now, neutrophil count > 500, and no symptoms to suggest infection at this time.  Also Levaquin can cause hyponatremia. -SCDs  Penile cancer-on paclitaxel.  -Follow-up on discharge   DVT prophylaxis: SCDs Code Status:  Full Family Communication: Daughter Adonis Huguenin at bedside Disposition Plan: To be determined Consults called: nephrology salt Admission status: Inpatient, ICU  Bethena Roys MD [facility] Pager [phone]  If 11PM-7AM, please contact night-coverage [email or web address] Password [facility]  07/20/2017, 8:23 PM

## 2017-07-21 ENCOUNTER — Other Ambulatory Visit: Payer: Self-pay

## 2017-07-21 LAB — SODIUM, URINE, RANDOM: Sodium, Ur: 49 mmol/L

## 2017-07-21 LAB — BASIC METABOLIC PANEL
ANION GAP: 12 (ref 5–15)
Anion gap: 9 (ref 5–15)
Anion gap: 9 (ref 5–15)
Anion gap: 10 (ref 5–15)
BUN: 23 mg/dL — AB (ref 6–20)
BUN: 21 mg/dL — AB (ref 6–20)
BUN: 21 mg/dL — AB (ref 6–20)
BUN: 20 mg/dL (ref 6–20)
CALCIUM: 8.2 mg/dL — AB (ref 8.9–10.3)
CALCIUM: 8.2 mg/dL — AB (ref 8.9–10.3)
CALCIUM: 8.4 mg/dL — AB (ref 8.9–10.3)
CO2: 25 mmol/L (ref 22–32)
CO2: 26 mmol/L (ref 22–32)
CO2: 25 mmol/L (ref 22–32)
CO2: 24 mmol/L (ref 22–32)
CREATININE: 1.48 mg/dL — AB (ref 0.61–1.24)
CREATININE: 1.35 mg/dL — AB (ref 0.61–1.24)
Calcium: 8.5 mg/dL — ABNORMAL LOW (ref 8.9–10.3)
Chloride: 79 mmol/L — ABNORMAL LOW (ref 101–111)
Chloride: 80 mmol/L — ABNORMAL LOW (ref 101–111)
Chloride: 84 mmol/L — ABNORMAL LOW (ref 101–111)
Chloride: 86 mmol/L — ABNORMAL LOW (ref 101–111)
Creatinine, Ser: 1.53 mg/dL — ABNORMAL HIGH (ref 0.61–1.24)
Creatinine, Ser: 1.38 mg/dL — ABNORMAL HIGH (ref 0.61–1.24)
GFR calc Af Amer: 49 mL/min — ABNORMAL LOW (ref >60)
GFR calc Af Amer: 54 mL/min — ABNORMAL LOW (ref >60)
GFR calc Af Amer: 53 mL/min — ABNORMAL LOW (ref >60)
GFR, EST AFRICAN AMERICAN: 47 mL/min — AB (ref >60)
GFR, EST NON AFRICAN AMERICAN: 40 mL/min — AB (ref >60)
GFR, EST NON AFRICAN AMERICAN: 42 mL/min — AB (ref >60)
GFR, EST NON AFRICAN AMERICAN: 47 mL/min — AB (ref >60)
GFR, EST NON AFRICAN AMERICAN: 46 mL/min — AB (ref >60)
GLUCOSE: 92 mg/dL (ref 65–99)
GLUCOSE: 92 mg/dL (ref 65–99)
Glucose, Bld: 102 mg/dL — ABNORMAL HIGH (ref 65–99)
Glucose, Bld: 98 mg/dL (ref 65–99)
POTASSIUM: 2.9 mmol/L — AB (ref 3.5–5.1)
POTASSIUM: 3.4 mmol/L — AB (ref 3.5–5.1)
Potassium: 2.9 mmol/L — ABNORMAL LOW (ref 3.5–5.1)
Potassium: 3.1 mmol/L — ABNORMAL LOW (ref 3.5–5.1)
SODIUM: 116 mmol/L — AB (ref 135–145)
Sodium: 115 mmol/L — CL (ref 135–145)
Sodium: 118 mmol/L — CL (ref 135–145)
Sodium: 120 mmol/L — ABNORMAL LOW (ref 135–145)

## 2017-07-21 LAB — CBC
HCT: 24.7 % — ABNORMAL LOW (ref 39.0–52.0)
Hemoglobin: 9.2 g/dL — ABNORMAL LOW (ref 13.0–17.0)
MCH: 31.1 pg (ref 26.0–34.0)
MCHC: 37.2 g/dL — ABNORMAL HIGH (ref 30.0–36.0)
MCV: 83.4 fL (ref 78.0–100.0)
PLATELETS: 72 10*3/uL — AB (ref 150–400)
RBC: 2.96 MIL/uL — ABNORMAL LOW (ref 4.22–5.81)
RDW: 37.8 % — AB (ref 11.5–15.5)
WBC: 1.6 10*3/uL — ABNORMAL LOW (ref 4.0–10.5)

## 2017-07-21 LAB — OSMOLALITY, URINE
Osmolality, Ur: 243 mOsm/kg — ABNORMAL LOW (ref 300–900)
Osmolality, Ur: 225 mOsm/kg — ABNORMAL LOW (ref 300–900)

## 2017-07-21 LAB — MAGNESIUM: Magnesium: 2 mg/dL (ref 1.7–2.4)

## 2017-07-21 LAB — TROPONIN I
Troponin I: 0.04 ng/mL (ref <0.03)
Troponin I: 0.04 ng/mL (ref <0.03)

## 2017-07-21 LAB — CREATININE, URINE, RANDOM: Creatinine, Urine: 41.08 mg/dL

## 2017-07-21 LAB — OSMOLALITY: OSMOLALITY: 244 mosm/kg — AB (ref 275–295)

## 2017-07-21 LAB — MRSA PCR SCREENING: MRSA by PCR: NEGATIVE

## 2017-07-21 MED ORDER — POTASSIUM CHLORIDE CRYS ER 20 MEQ PO TBCR: 20.0000 meq | EXTENDED_RELEASE_TABLET | Freq: Once | ORAL | Status: DC

## 2017-07-21 MED ORDER — POTASSIUM CHLORIDE 10 MEQ/100ML IV SOLN
10.0000 meq | INTRAVENOUS | Status: AC
  Administered 2017-07-21 (×2): 10 meq via INTRAVENOUS
  Filled 2017-07-21 (×2): qty 100

## 2017-07-21 MED ORDER — POTASSIUM CHLORIDE CRYS ER 20 MEQ PO TBCR
40.0000 meq | EXTENDED_RELEASE_TABLET | ORAL | Status: AC
  Administered 2017-07-21 (×3): 40 meq via ORAL
  Filled 2017-07-21 (×3): qty 2

## 2017-07-21 MED ORDER — SODIUM CHLORIDE 0.9 % IV SOLN
INTRAVENOUS | Status: DC
  Administered 2017-07-21 – 2017-07-23 (×4): via INTRAVENOUS

## 2017-07-21 NOTE — Progress Notes (Addendum)
PROGRESS NOTE    Patient: Christian Sherman     PCP: Manon Hilding, MD                    DOB: [birth year]            DOA: 07/20/2017 MBW:[id number]             DOS: 07/21/2017, 12:31 PM   Date of Service: the patient was seen and examined on 07/21/2017 Subjective:  She was seen and examined this morning awake alert oriented no acute distress accompanied by his wife. No issues overnight, currently able. Sodium is improved from once 18-120 now  ----------------------------------------------------------------------------------------------------------------------  Brief Narrative:  Christian Sherman is a 84 y.o. male with medical history significant for penile ca- actively undergoing treatment, COPD, HTN, who presented to the from home with complaints of generalized weakness of 3 days duration, lightheadedness, could not move his legs and several falls over the past 3 days.  Reports hitting his head when he fell at least once.  Patient lives with his daughter.  Daughter-[name], reports poor appetite for 3 weeks since he started chemotherapy paclitaxel for his penile cancer.  Reports over the past 3 days patient has barely eaten anything, but has been drinking at least 4-5  16 ounce bottles of water daily, times more.  Would sometimes drink Gatorade or Sprite.  Patient has been taking all his prescribed medications which include Lasix 20 mg every day, and lisinopril with HCTZ 20/25 mg daily. Upon admission he denied having any nausea vomiting headaches or seizures.  Shortness of breath or chest pain. Daughter reported he has been on Levaquin for low white count, no source of infection.  He has not had his chemotherapy last week due to low white count.  Upon admission patient's sodium was as low as 111, potassium 2.1, mildly elevated creatinine  Assessment & Plan:    Principal Problem:   Hyponatremia Active Problems:   Penile cancer (HCC)   Hypokalemia   COPD (chronic obstructive pulmonary  disease) (HCC)   Essential hypertension   Pancytopenia (HCC)   Severe hyponatremia- Na- 111.  Likely multifactorial.  Poor p.o. Intake, polydipsia, and ongoing diuretic use-Lasix -20mg  and HCTZ- 25mg .  Appears mildly-moderately symptomatic with dizziness multiple falls likely from gait disturbances.  Head CT negative.  2 L normal saline given in ED. Na recheck 114. -3% saline X12 hrs, sodium improved to 120, nephrologist has recommended DC hypertonic saline initiating normal saline  We will follow-up with serum osmolality, Urine osmolality, Urine sodium TSH normal at 3.7,,  Continue to follow with labs including BMP -Goal sodium increase 10- 12 in 24 hours -Seizure precautions Will advance his diet - Nephrology consult- order placed - D/c 3% when Na is 120  - Addendum- Na- 111>>116>115-->120, D/C 3% Saline, per nephrologist recommendation will be changed to normal saline  Severe hypokalemia-2.1 with hyponatremia, concurrent electrolyte abnormality suggest diuretic use as etiology. Mag- 1.7.  2g mag given in ED -IV KCL 10 X 3 started in ED - 40 PO KCL X3 Potassium has improved to 4.3 today  AKI - creatinine 1.6.  Last creatinine in our system 1.2.  Likely from poor p.o. intake and ongoing diuretic use. BUN/creatinine closely 20/1.38   COPD-wheezing only, no cough or SOB -Hold off on steroids - Duonebs  Hypertension-blood pressure stable. -Hold antihypertensives HCTZ considering severe hyponatremia -Hold lisinopril-20mg  will mild AKI  Pancytopenia-WBC 1.7 , Abs neutrophil count- 1000, hemoglobin 9.3, platelets 73.  Likely from chemotherapy.  Patient was given Levaquin for low WBC.  Chest x-ray ,UA not suggesting infection -Hold off on antibiotics for now, neutrophil count > 500, and no symptoms to suggest infection at this time.  Also Levaquin can cause hyponatremia. -SCDs  Penile cancer-on paclitaxel.  -Follow-up on discharge   DVT prophylaxis: SCDs Code Status:  Full Family Communication: Daughter Adonis Huguenin at bedside Disposition Plan: To be determined Consults called: nephrology salt Admission status: Inpatient, ICU    Family Communication:  The above findings and plan of care has been discussed with patient and family in detail, they expressed understanding and agreement of above.   Consultants:   Nephrologist  Procedures:  No admission procedures for hospital encounter.   Antimicrobials:  Anti-infectives (From admission, onward)   None        Objective: Vitals:   07/21/17 1000 07/21/17 1100 07/21/17 1119 07/21/17 1200  BP: (!) 123/54 128/60  125/64  Pulse: 62 62 (!) 129 65  Resp: 12 13 18 19   Temp:   98.2 F (36.8 C)   TempSrc:   Oral   SpO2: 99% 98% 100% 99%  Weight:      Height:        Intake/Output Summary (Last 24 hours) at 07/21/2017 1231 Last data filed at 07/21/2017 1000 Gross per 24 hour  Intake 2841 ml  Output 2150 ml  Net 691 ml   Filed Weights   07/20/17 1349 07/20/17 2212 07/21/17 0500  Weight: 104.3 kg (230 lb) 106.2 kg (234 lb 2.1 oz) 106.2 kg (234 lb 2.1 oz)    Examination:  General exam: Appears calm and comfortable  Respiratory system: Clear to auscultation. Respiratory effort normal. Cardiovascular system: S1 & S2 heard, RRR. No JVD, murmurs, rubs, gallops or clicks. No pedal edema. Gastrointestinal system: Abdomen is nondistended, soft and nontender. No organomegaly or masses felt. Normal bowel sounds heard. Central nervous system: Alert and oriented. No focal neurological deficits. Extremities: Symmetric 5 x 5 power. Skin: No rashes, lesions or ulcers Psychiatry: Judgement and insight appear normal. Mood & affect appropriate.  Genitourinary -tract penis, normal testicles, negative erythema edema   Data Reviewed: I have personally reviewed following labs and imaging studies  CBC: Recent Labs  Lab 07/20/17 1705 07/21/17 0508  WBC 1.7* 1.6*  NEUTROABS 1.0*  --   HGB 9.3* 9.2*  HCT 25.1*  24.7*  MCV 83.4 83.4  PLT 73* 72*   Basic Metabolic Panel: Recent Labs  Lab 07/20/17 1714 07/20/17 2005 07/20/17 2321 07/21/17 0142 07/21/17 0508 07/21/17 0750  NA  --  114* 116* 115* 118* 120*  K  --  3.1* 2.9* 2.9* 3.1* 3.4*  CL  --  76* 79* 80* 84* 86*  CO2  --  25 25 26 25 24   GLUCOSE  --  112* 102* 98 92 92  BUN  --  24* 23* 21* 21* 20  CREATININE  --  1.63* 1.53* 1.48* 1.35* 1.38*  CALCIUM  --  8.2* 8.5* 8.2* 8.2* 8.4*  MG 1.7  --   --   --   --  2.0   GFR: Estimated Creatinine Clearance: 48.7 mL/min (A) (by C-G formula based on SCr of 1.38 mg/dL (H)). Liver Function Tests: Recent Labs  Lab 07/20/17 1705  AST 52*  ALT 25  ALKPHOS 63  BILITOT 0.9  PROT 5.8*  ALBUMIN 3.3*   No results for input(s): LIPASE, AMYLASE in the last 168 hours. No results for input(s): AMMONIA in the last 168  hours. Coagulation Profile: No results for input(s): INR, PROTIME in the last 168 hours. Cardiac Enzymes: Recent Labs  Lab 07/20/17 1705 07/20/17 2321 07/21/17 0508  TROPONINI 0.04* 0.04* 0.04*   BNP (last 3 results) No results for input(s): PROBNP in the last 8760 hours. HbA1C: No results for input(s): HGBA1C in the last 72 hours. CBG: No results for input(s): GLUCAP in the last 168 hours. Lipid Profile: No results for input(s): CHOL, HDL, LDLCALC, TRIG, CHOLHDL, LDLDIRECT in the last 72 hours. Thyroid Function Tests: Recent Labs    07/20/17 2005  TSH 3.794   Anemia Panel: No results for input(s): VITAMINB12, FOLATE, FERRITIN, TIBC, IRON, RETICCTPCT in the last 72 hours. Sepsis Labs: Recent Labs  Lab 07/20/17 1530 07/20/17 1715  LATICACIDVEN 0.82 1.51    Recent Results (from the past 240 hour(s))  Blood Culture (routine x 2)     Status: None (Preliminary result)   Collection Time: 07/20/17  3:05 PM  Result Value Ref Range Status   Specimen Description BLOOD RIGHT ANTECUBITAL  Final   Special Requests   Final    Blood Culture results may not be optimal  due to an inadequate volume of blood received in culture bottles   Culture NO GROWTH < 24 HOURS  Final   Report Status PENDING  Incomplete  Blood Culture (routine x 2)     Status: None (Preliminary result)   Collection Time: 07/20/17  3:10 PM  Result Value Ref Range Status   Specimen Description BLOOD PORTA CATH RIGHT CHEST  Final   Special Requests Blood Culture adequate volume  Final   Culture NO GROWTH < 24 HOURS  Final   Report Status PENDING  Incomplete  MRSA PCR Screening     Status: None   Collection Time: 07/20/17 10:10 PM  Result Value Ref Range Status   MRSA by PCR NEGATIVE NEGATIVE Final    Comment:        The GeneXpert MRSA Assay (FDA approved for NASAL specimens only), is one component of a comprehensive MRSA colonization surveillance program. It is not intended to diagnose MRSA infection nor to guide or monitor treatment for MRSA infections.        Radiology Studies: Dg Chest 2 View  Result Date: 07/20/2017 CLINICAL DATA:  Weakness EXAM: CHEST  2 VIEW COMPARISON:  06/28/2017 FINDINGS: Mild linear scarring/atelectasis at the right lung base. Left lung is clear. No pleural effusion or pneumothorax. The heart is normal in size. Right IJ chest port terminates in the lower SVC. Degenerative changes of the visualized thoracolumbar spine. IMPRESSION: No evidence of acute cardiopulmonary disease. Electronically Signed   By: Julian Hy M.D.   On: 07/20/2017 15:56   Ct Head Wo Contrast  Result Date: 07/20/2017 CLINICAL DATA:  Generalized weakness for 1 week.  No known injury. EXAM: CT HEAD WITHOUT CONTRAST TECHNIQUE: Contiguous axial images were obtained from the base of the skull through the vertex without intravenous contrast. COMPARISON:  None. FINDINGS: Brain: Atrophy and chronic microvascular ischemic change are identified. No evidence of acute abnormality including hemorrhage, infarct, mass lesion, mass effect, midline shift or abnormal extra-axial fluid collection.  Vascular: Extensive atherosclerosis.  Otherwise unremarkable. Skull: Intact. Sinuses/Orbits: Negative. Other: None. IMPRESSION: No acute abnormality. Atrophy and chronic microvascular ischemic change. Atherosclerosis. Electronically Signed   By: Inge Rise M.D.   On: 07/20/2017 16:11    Scheduled Meds: . tamsulosin  0.4 mg Oral QPC supper   Continuous Infusions: . sodium chloride 75 mL/hr at 07/21/17 1103  LOS: 1 day    Time spent: >35 minutes,    Deatra James, MD [facility] Pager [phone]  If 7PM-7AM, please contact night-coverage [email or web address] Password [facility] 07/21/2017, 12:31 PM

## 2017-07-21 NOTE — Progress Notes (Signed)
CRITICAL VALUE ALERT  Critical Value:  Na 118  Date & Time Notied:  07/21/2017 0600  Provider Notified: Manuella Ghazi MD

## 2017-07-21 NOTE — Progress Notes (Signed)
Repeat sodium is 118.  Continue 3% normal saline and repeat BMP in 3 hours as noted.  Once sodium is above 08/01/2008 DC 3% normal saline and switch to normal saline.

## 2017-07-21 NOTE — Progress Notes (Signed)
CRITICAL VALUE ALERT  Critical Value:  Na+ 115  Date & Time Notied:   07/21/2017 0248  Provider Notified: Denton Brick, MD

## 2017-07-21 NOTE — Progress Notes (Addendum)
CRITICAL VALUE ALERT  Critical Value:  Sodium 116  Date & Time Notied:  07/21/2017 0018  Provider Notified: Schorr NP

## 2017-07-21 NOTE — Consult Note (Signed)
OSKER AYOUB MRN: [id number] DOB/AGE: 82/29/1933 82 y.o. Primary Care Physician:Sasser, Silvestre Moment, MD Admit date: 07/20/2017 Chief Complaint:  Chief Complaint  Patient presents with  . Weakness   HPI: Pt is a 84 year old  male with past medical history significant for penile cancer, undergoing treatment who presented to the ER c/o  of generalized weakness.  HPI dates back to 3-4  days when pt started having  lightheadedness. Pt was weak and was not able  to move his legs and several falls over the past 3 days.   Pt laso c/o  hitting his head when he fell at least once.   Pt daughter (MsVivian) reported  poor appetite for three weeks . She also reported over the past 3 days patient has barely eaten anything, but has been taking all his prescribed medications which include Lasix 20 mg every day, and lisinopril with HCTZ 20/25 mg daily.  Upon evaluation in ER pt was found to have hyponatremia with  Na markedly low at 111, pt was admitted for further tx. Pt seen today in ICU. Pt says " I am hungry" Pt offers no new complaints. NO c/o fever/chills NO c/o chest pain/shortness of breath NO c/o nausea/vomiting NO c/o abdominal pain    Past Medical History:  Diagnosis Date  . COPD (chronic obstructive pulmonary disease) ([place])   . Emphysema lung ([place])   . Hard of hearing   . Hypercholesteremia   . Hypertension   . Penile cancer ([place])         Family History  Problem Relation Age of Onset  . Cancer Mother   . Heart disease Father   . Cancer Sister   . Stomach cancer Brother   . Pancreatic cancer Brother   . Heart disease Sister   . Heart disease Sister     Social History:  reports that he quit smoking about 52 years ago. His smoking use included cigarettes. he has never used smokeless tobacco. He reports that he does not drink alcohol or use drugs.   Allergies:  Allergies  Allergen Reactions  . Penicillin G Swelling    Arm swelling noted per pt Has patient had a  PCN reaction causing immediate rash, facial/tongue/throat swelling, SOB or lightheadedness with hypotension: no Has patient had a PCN reaction causing severe rash involving mucus membranes or skin necrosis: No Has patient had a PCN reaction that required hospitalization: No Has patient had a PCN reaction occurring within the last 10 years: No If all of the above answers are "NO", then may proceed with Cephalosporin use.     Medications Prior to Admission  Medication Sig Dispense Refill  . acetaminophen (TYLENOL) 650 MG CR tablet Take 650 mg by mouth daily as needed for pain.     Marland Kitchen albuterol (PROVENTIL HFA;VENTOLIN HFA) 108 (90 Base) MCG/ACT inhaler Inhale into the lungs.    Marland Kitchen aspirin EC 81 MG tablet Take 81 mg by mouth at bedtime.     . bacitracin 500 UNIT/GM ointment Apply 1 application topically 2 (two) times daily as needed for wound care.     . bisacodyl (BISACODYL) 5 MG EC tablet Take 5 mg by mouth daily as needed for mild constipation or moderate constipation.    . Cholecalciferol (VITAMIN D3) 1000 units CAPS Take 1 capsule by mouth daily.    Marland Kitchen dexamethasone (DECADRON) 4 MG tablet Take 8 mg by mouth See admin instructions. Take 2 tablets the night before chemo (paclitaxel).    . furosemide (  LASIX) 20 MG tablet Take 20 mg by mouth daily.    Marland Kitchen levofloxacin (LEVAQUIN) 500 MG tablet Take 1 tablet by mouth daily.  0  . lisinopril-hydrochlorothiazide (PRINZIDE,ZESTORETIC) 20-25 MG tablet Take by mouth.    . Melatonin 3 MG TABS Take 6 mg by mouth at bedtime.     . multivitamin (ONE-A-DAY MEN'S) TABS tablet Take by mouth.    . ondansetron (ZOFRAN) 8 MG tablet Take 8 mg by mouth every 12 (twelve) hours as needed for nausea/vomiting.    . Red Yeast Rice 600 MG TABS Take 1,800 mg by mouth daily.     . tamsulosin (FLOMAX) 0.4 MG CAPS capsule Take 0.4 mg by mouth.         BSJ:GGEZM from the symptoms mentioned above,there are no other symptoms referable to all systems reviewed.  . potassium  chloride  40 mEq Oral Q4H  . tamsulosin  0.4 mg Oral QPC supper         OQH:UTMLY from the symptoms mentioned above,there are no other symptoms referable to all systems reviewed.  Physical Exam: Vital signs in last 24 hours: Temp:  [97.8 F (36.6 C)-98.6 F (37 C)] 97.8 F (36.6 C) (01/09 0750) Pulse Rate:  [57-83] 60 (01/09 0750) Resp:  [13-24] 16 (01/09 0750) BP: (111-166)/(56-99) 142/99 (01/09 0700) SpO2:  [93 %-100 %] 98 % (01/09 0750) Weight:  [230 lb (104.3 kg)-234 lb 2.1 oz (106.2 kg)] 234 lb 2.1 oz (106.2 kg) (01/09 0500) Weight change:  Last BM Date: 07/17/17  Intake/Output from previous day: 01/08 0701 - 01/09 0700 In: 2841 [P.O.:200; I.V.:191; IV Piggyback:2450] Out: 1650 [Urine:1650] No intake/output data recorded.   Physical Exam: General- pt is awake,alert, oriented to time place and person Resp- No acute REsp distress, Rhonchi+  CVS- S1S2 regular in rate and rhythm GIT- BS+, soft, NT, ND EXT- NO LE Edema, NO Cyanosis CNS- CN 2-12 grossly intact. Moving all 4 extremities Psych- normal mood and affect    Lab Results: CBC Recent Labs    07/20/17 1705 07/21/17 0508  WBC 1.7* 1.6*  HGB 9.3* 9.2*  HCT 25.1* 24.7*  PLT 73* 72*    BMET Recent Labs    07/21/17 0508 07/21/17 0750  NA 118* 120*  K 3.1* 3.4*  CL 84* 86*  CO2 25 24  GLUCOSE 92 92  BUN 21* 20  CREATININE 1.35* 1.38*  CALCIUM 8.2* 8.4*   Creat trend 2019 1.5=> 1.3 2018  1.2   Sodium trend   2019  111=> 120   MICRO Recent Results (from the past 240 hour(s))  Blood Culture (routine x 2)     Status: None (Preliminary result)   Collection Time: 07/20/17  3:05 PM  Result Value Ref Range Status   Specimen Description BLOOD RIGHT ANTECUBITAL  Final   Special Requests   Final    Blood Culture results may not be optimal due to an inadequate volume of blood received in culture bottles   Culture NO GROWTH < 24 HOURS  Final   Report Status PENDING  Incomplete  Blood Culture  (routine x 2)     Status: None (Preliminary result)   Collection Time: 07/20/17  3:10 PM  Result Value Ref Range Status   Specimen Description BLOOD PORTA CATH RIGHT CHEST  Final   Special Requests Blood Culture adequate volume  Final   Culture NO GROWTH < 24 HOURS  Final   Report Status PENDING  Incomplete  MRSA PCR Screening  Status: None   Collection Time: 07/20/17 10:10 PM  Result Value Ref Range Status   MRSA by PCR NEGATIVE NEGATIVE Final    Comment:        The GeneXpert MRSA Assay (FDA approved for NASAL specimens only), is one component of a comprehensive MRSA colonization surveillance program. It is not intended to diagnose MRSA infection nor to guide or monitor treatment for MRSA infections.       Lab Results  Component Value Date   CALCIUM 8.4 (L) 07/21/2017      Impression: 1)Renal  AKI secondary to Hypovolemia               AKI sec to ACE/HCTZ/Poor po intake                AKI on CKD               CKD stage 2/3.               CKD since 2018               CKD secondary to Age associated decline/HTN                Progression of CKD now marked with AKI                2)HTN  BP stable  Medication- Was On RAS blockers. Was On Diuretics.    3)Haematology- Pancytopenia Anemia. Leukopenia Thrombocytopenia Sec to penile ca   4)Oncology-Hx of Penile ca Following with oncology   5)Hypokalemia Better Primary MD following  6)Hyponatremia Multifactorial Poor po intake/HCTZ/SIADH? On IV 3% Na now better 111=> 120  TSH -wnl CXR-no acute issues   7)Acid base Co2 at goal      Plan:  Will suggest to d/c 3% Will suggest to ask for Urine na and Creat Agree with d/c HCTZ Agree with d/c ace Will suggest to start IV NS at 77ml/hr and follow bmet q 4 hours     Keyla Milone S 07/21/2017, 9:16 AM

## 2017-07-22 DIAGNOSIS — E871 Hypo-osmolality and hyponatremia: Principal | ICD-10-CM

## 2017-07-22 DIAGNOSIS — N179 Acute kidney failure, unspecified: Secondary | ICD-10-CM

## 2017-07-22 DIAGNOSIS — I10 Essential (primary) hypertension: Secondary | ICD-10-CM

## 2017-07-22 DIAGNOSIS — D61818 Other pancytopenia: Secondary | ICD-10-CM

## 2017-07-22 DIAGNOSIS — C609 Malignant neoplasm of penis, unspecified: Secondary | ICD-10-CM

## 2017-07-22 DIAGNOSIS — E876 Hypokalemia: Secondary | ICD-10-CM

## 2017-07-22 LAB — BASIC METABOLIC PANEL
Anion gap: 9 (ref 5–15)
BUN: 16 mg/dL (ref 6–20)
CHLORIDE: 94 mmol/L — AB (ref 101–111)
CO2: 23 mmol/L (ref 22–32)
Calcium: 8.5 mg/dL — ABNORMAL LOW (ref 8.9–10.3)
Creatinine, Ser: 1.17 mg/dL (ref 0.61–1.24)
GFR calc non Af Amer: 56 mL/min — ABNORMAL LOW (ref >60)
Glucose, Bld: 92 mg/dL (ref 65–99)
POTASSIUM: 3.8 mmol/L (ref 3.5–5.1)
SODIUM: 126 mmol/L — AB (ref 135–145)

## 2017-07-22 NOTE — Progress Notes (Signed)
Called report to shannon  Ozan Maclay Rica Mote, RN

## 2017-07-22 NOTE — Plan of Care (Signed)
  Acute Rehab PT Goals(only PT should resolve) Pt Will Go Supine/Side To Sit 07/22/2017 1545 - Progressing by Lonell Grandchild, PT Flowsheets Taken 07/22/2017 1545  Pt will go Supine/Side to Sit with supervision Patient Will Transfer Sit To/From Stand 07/22/2017 1545 - Progressing by Lonell Grandchild, PT Flowsheets Taken 07/22/2017 1545  Patient will transfer sit to/from stand with supervision Pt Will Transfer Bed To Chair/Chair To Bed 07/22/2017 1545 - Progressing by Lonell Grandchild, PT Flowsheets Taken 07/22/2017 1545  Pt will Transfer Bed to Chair/Chair to Bed with supervision Pt Will Ambulate 07/22/2017 1545 - Progressing by Lonell Grandchild, PT Flowsheets Taken 07/22/2017 1545  Pt will Ambulate 75 feet;with rolling walker;with supervision  07/22/17, 3:48 PM Lonell Grandchild, MPT Physical Therapist with [facility] [phone] office [phone] mobile phone

## 2017-07-22 NOTE — Evaluation (Signed)
Physical Therapy Evaluation Patient Details Name: Christian Sherman MRN: [id number] DOB: [birth year] Today's Date: 07/22/2017   History of Present Illness  Christian Sherman is a 84 y.o. male with medical history significant for penile ca- actively undergoing treatment, COPD, HTN, who presented to the from home with complaints of generalized weakness of 3 days duration, lightheadedness, could not move his legs and several falls over the past 3 days.  Reports hitting his head when he fell at least once.  Patient lives with his daughter.  Daughter-[name], reports poor appetite for 3 weeks since he started chemotherapy paclitaxel for his penile cancer.  Reports over the past 3 days patient has barely eaten anything, but has been drinking at least 4-5  16 ounce bottles of water daily, times more.  Would sometimes drink Gatorade or Sprite.  Patient has been taking all his prescribed medications which include Lasix 20 mg every day, and lisinopril with HCTZ 20/25 mg daily    Clinical Impression  Patient functioning near baseline for functional mobility and gait, slightly limited for gait endurance due to c/o fatigue, requires repeated verbal cues for safety/directions mostly due to very hard of hearing and tolerated sitting up in chair after therapy with his daughter supervising - RN aware.  Patient will benefit from continued physical therapy in hospital and recommended venue below to increase strength, balance, endurance for safe ADLs and gait.     Follow Up Recommendations Home health PT;Supervision for mobility/OOB    Equipment Recommendations  None recommended by PT    Recommendations for Other Services       Precautions / Restrictions Precautions Precautions: Fall Restrictions Weight Bearing Restrictions: No      Mobility  Bed Mobility Overal bed mobility: Needs Assistance Bed Mobility: Supine to Sit     Supine to sit: Min guard        Transfers Overall transfer level:  Needs assistance Equipment used: Rolling walker (2 wheeled) Transfers: Sit to/from [facility] Sit to Stand: Min guard Stand pivot transfers: Min guard          Ambulation/Gait Ambulation/Gait assistance: Min guard Ambulation Distance (Feet): 55 Feet Assistive device: Rolling walker (2 wheeled) Gait Pattern/deviations: Decreased step length - right;Decreased step length - left;Decreased stride length   Gait velocity interpretation: Below normal speed for age/gender General Gait Details: Patient demonstrates slightly labored slow cadence without loss of balance, limited secondary to fatigue  Stairs            Wheelchair Mobility    Modified Rankin (Stroke Patients Only)       Balance Overall balance assessment: Needs assistance Sitting-balance support: No upper extremity supported;Feet supported Sitting balance-Leahy Scale: Good     Standing balance support: Bilateral upper extremity supported;During functional activity Standing balance-Leahy Scale: Fair                               Pertinent Vitals/Pain Pain Assessment: No/denies pain    Home Living Family/patient expects to be discharged to:: Private residence Living Arrangements: Children Available Help at Discharge: Family;Available 24 hours/day Type of Home: House Home Access: Ramped entrance     Home Layout: One level Home Equipment: Walker - 2 wheels;Cane - single point;Shower seat;Bedside commode      Prior Function Level of Independence: Needs assistance   Gait / Transfers Assistance Needed: household ambulation with SPC most of time, occasionally uses RW  ADL's / Homemaking Assistance Needed: assisted  by his daughter         Hand Dominance        Extremity/Trunk Assessment   Upper Extremity Assessment Upper Extremity Assessment: Generalized weakness    Lower Extremity Assessment Lower Extremity Assessment: Generalized weakness    Cervical / Trunk  Assessment Cervical / Trunk Assessment: Normal  Communication   Communication: HOH  Cognition Arousal/Alertness: Awake/alert Behavior During Therapy: WFL for tasks assessed/performed Overall Cognitive Status: Within Functional Limits for tasks assessed                                        General Comments      Exercises     Assessment/Plan    PT Assessment Patient needs continued PT services  PT Problem List Decreased strength;Decreased activity tolerance;Decreased balance;Decreased mobility       PT Treatment Interventions Gait training;Functional mobility training;Therapeutic activities;Therapeutic exercise;Patient/family education    PT Goals (Current goals can be found in the Care Plan section)  Acute Rehab PT Goals Patient Stated Goal: return home with his daughter to assist PT Goal Formulation: With patient/family Time For Goal Achievement: 07/26/17 Potential to Achieve Goals: Good    Frequency Min 3X/week   Barriers to discharge        Co-evaluation               AM-PAC PT "6 Clicks" Daily Activity  Outcome Measure Difficulty turning over in bed (including adjusting bedclothes, sheets and blankets)?: None Difficulty moving from lying on back to sitting on the side of the bed? : None Difficulty sitting down on and standing up from a chair with arms (e.g., wheelchair, bedside commode, etc,.)?: A Little Help needed moving to and from a bed to chair (including a wheelchair)?: A Little Help needed walking in hospital room?: A Little Help needed climbing 3-5 steps with a railing? : A Little 6 Click Score: 20    End of Session Equipment Utilized During Treatment: Gait belt Activity Tolerance: Patient tolerated treatment well;Patient limited by fatigue Patient left: in chair;with call bell/phone within reach;with family/visitor present(RN notified patient left in chair) Nurse Communication: Mobility status PT Visit Diagnosis: Unsteadiness  on feet (R26.81);Other abnormalities of gait and mobility (R26.89);Muscle weakness (generalized) (M62.81)    Time: 7341-9379 PT Time Calculation (min) (ACUTE ONLY): 28 min   Charges:   PT Evaluation $PT Eval Moderate Complexity: 1 Mod PT Treatments $Therapeutic Activity: 23-37 mins   PT G Codes:        3:43 PM, August 17, 2017 Lonell Grandchild, MPT Physical Therapist with [facility] [phone] office [phone] mobile phone

## 2017-07-22 NOTE — Progress Notes (Signed)
Subjective: Interval History: has no complaint of difficulty breathing.  Patient presently offers no complaints.  He does not have any nausea or vomiting..  Objective: Vital signs in last 24 hours: Temp:  [98 F (36.7 C)-99.1 F (37.3 C)] 98 F (36.7 C) (01/10 0400) Pulse Rate:  [60-129] 62 (01/10 0800) Resp:  [9-22] 19 (01/10 0800) BP: (108-150)/(54-76) 110/58 (01/10 0800) SpO2:  [95 %-100 %] 95 % (01/10 0800) Weight:  [107.2 kg (236 lb 5.3 oz)] 107.2 kg (236 lb 5.3 oz) (01/10 0400) Weight change: 2.873 kg (6 lb 5.3 oz)  Intake/Output from previous day: 01/09 0701 - 01/10 0700 In: 296.3 [I.V.:296.3] Out: 1250 [Urine:1250] Intake/Output this shift: No intake/output data recorded.  General appearance: alert, cooperative and no distress Resp: clear to auscultation bilaterally Cardio: regular rate and rhythm Extremities: No edema  Lab Results: Recent Labs    07/20/17 1705 07/21/17 0508  WBC 1.7* 1.6*  HGB 9.3* 9.2*  HCT 25.1* 24.7*  PLT 73* 72*   BMET:  Recent Labs    07/21/17 0750 07/22/17 0445  NA 120* 126*  K 3.4* 3.8  CL 86* 94*  CO2 24 23  GLUCOSE 92 92  BUN 20 16  CREATININE 1.38* 1.17  CALCIUM 8.4* 8.5*   No results for input(s): PTH in the last 72 hours. Iron Studies: No results for input(s): IRON, TIBC, TRANSFERRIN, FERRITIN in the last 72 hours.  Studies/Results: Dg Chest 2 View  Result Date: 07/20/2017 CLINICAL DATA:  Weakness EXAM: CHEST  2 VIEW COMPARISON:  06/28/2017 FINDINGS: Mild linear scarring/atelectasis at the right lung base. Left lung is clear. No pleural effusion or pneumothorax. The heart is normal in size. Right IJ chest port terminates in the lower SVC. Degenerative changes of the visualized thoracolumbar spine. IMPRESSION: No evidence of acute cardiopulmonary disease. Electronically Signed   By: Julian Hy M.D.   On: 07/20/2017 15:56   Ct Head Wo Contrast  Result Date: 07/20/2017 CLINICAL DATA:  Generalized weakness for 1  week.  No known injury. EXAM: CT HEAD WITHOUT CONTRAST TECHNIQUE: Contiguous axial images were obtained from the base of the skull through the vertex without intravenous contrast. COMPARISON:  None. FINDINGS: Brain: Atrophy and chronic microvascular ischemic change are identified. No evidence of acute abnormality including hemorrhage, infarct, mass lesion, mass effect, midline shift or abnormal extra-axial fluid collection. Vascular: Extensive atherosclerosis.  Otherwise unremarkable. Skull: Intact. Sinuses/Orbits: Negative. Other: None. IMPRESSION: No acute abnormality. Atrophy and chronic microvascular ischemic change. Atherosclerosis. Electronically Signed   By: Inge Rise M.D.   On: 07/20/2017 16:11    I have reviewed the patient's current medications.  Assessment/Plan: 1] acute kidney injury superimposed on chronic.  Presently his renal function has recovered and returned to baseline.  Etiology was thought to be secondary to ACE inhibitor/hydrochlorothiazide/prerenal syndrome.  Patient with underlying chronic renal failure possibly stage II/III. 2] hypokalemia: His potassium is normal 3] hyponatremia: Possibly hypovolemic hyponatremia.  His sodium is 126 and improving 4] hypertension: His blood pressure is reasonably controlled 5] pancytopenia: Possibly from chemotherapy 6] history of penile cancer. Plan: We will continue his present management 2] we will check his renal panel in the morning.    LOS: 2 days   Christian Sherman S 07/22/2017,8:20 AM

## 2017-07-22 NOTE — Progress Notes (Signed)
PROGRESS NOTE    Christian Sherman  ZTI:[id number] DOB: Oct 16, 1931 DOA: 07/20/2017 PCP: Manon Hilding, MD    Brief Narrative:  Christian Sherman a 84 y.o.malewith medical history significantfor penile ca- actively undergoing treatment, COPD, HTN,who presented to the from home with complaints of generalized weakness of 3 days duration,lightheadedness, couldnotmove his legs and several falls over the past 3 days.Reports hitting his head whenhe fell at least once.Patient lives with his daughter.Daughter-[name],reports poor appetite for 3 weeks since he started chemotherapy paclitaxel for his penile cancer.Reports over the past 3 days patient has barely eaten anything,but has been drinking at least 4-5 16 ounce bottles of water daily,timesmore.Would sometimes drink Gatorade or Sprite.Patient has been taking all his prescribed medications which include Lasix 20 mg every day,and lisinopril with HCTZ 20/25mg daily. Upon admission he denied having any nausea vomiting headaches or seizures.  Shortness of breath or chest pain. Daughter reported he has been on Levaquin for low white count, no source of infection.  He has not had his chemotherapy last week due to low white count.  Upon admission patient's sodium was as low as 111, potassium 2.1, mildly elevated creatinine     Assessment & Plan:   Principal Problem:   Hyponatremia Active Problems:   Penile cancer (HCC)   Hypokalemia   COPD (chronic obstructive pulmonary disease) (HCC)   Essential hypertension   Pancytopenia ([place])   1. Hyponatremia.  Patient had a sodium of 111 on admission.  Felt to be hypovolemic hyponatremia.  Started on intravenous fluids.  Initially was receiving 3% saline, but has since been transitioned to normal saline with improvement of sodium.  Hydrochlorothiazide and Lasix has been held. 2. Acute kidney injury superimposed on chronic kidney disease stage II-III.  Creatinine 1.6 on  admission.  This is improved to 1.1 today with hydration.  ACE inhibitor as well as diuretics held on admission.  Continue current treatments. 3. Hypokalemia.  Likely related to diuretics.  Replaced. 4. Hypertension.  Blood pressure is stable.  ACE inhibitor, hydrochlorothiazide, Lasix currently on hold. 5. Pancytopenia.  Likely related to chemotherapy.  It appears stable. 6. Penile cancer.  Currently on chemotherapy.  Follow-up with oncology. 7. BPH.  Continue on tamsulosin.   DVT prophylaxis: SCDs Code Status: Full code Family Communication: Discussed with daughter at the bedside Disposition Plan: Discharge home in improved   Consultants:   Nephrology  Procedures:     Antimicrobials:       Subjective: Feeling better today.  No vomiting, diarrhea.  Generalized weakness is better.  Objective: Vitals:   07/22/17 0500 07/22/17 0600 07/22/17 0700 07/22/17 0800  BP: (!) 108/59 127/63 (!) 142/69 (!) 110/58  Pulse: 61 62 70 62  Resp: 14 18 16 19   Temp:    (!) 97.2 F (36.2 C)  TempSrc:    Axillary  SpO2: 99% 97% 99% 95%  Weight:      Height:        Intake/Output Summary (Last 24 hours) at 07/22/2017 0937 Last data filed at 07/22/2017 0121 Gross per 24 hour  Intake 1072.5 ml  Output 1050 ml  Net 22.5 ml   Filed Weights   07/20/17 2212 07/21/17 0500 07/22/17 0400  Weight: 106.2 kg (234 lb 2.1 oz) 106.2 kg (234 lb 2.1 oz) 107.2 kg (236 lb 5.3 oz)    Examination:  General exam: Appears calm and comfortable  Respiratory system: Clear to auscultation. Respiratory effort normal. Cardiovascular system: S1 & S2 heard, RRR. No JVD, murmurs, rubs, gallops  or clicks. No pedal edema. Gastrointestinal system: Abdomen is nondistended, soft and nontender. No organomegaly or masses felt. Normal bowel sounds heard. Central nervous system: Alert and oriented. No focal neurological deficits. Extremities: Symmetric 5 x 5 power. Skin: No rashes, lesions or ulcers Psychiatry:  Judgement and insight appear normal. Mood & affect appropriate.     Data Reviewed: I have personally reviewed following labs and imaging studies  CBC: Recent Labs  Lab 07/20/17 1705 07/21/17 0508  WBC 1.7* 1.6*  NEUTROABS 1.0*  --   HGB 9.3* 9.2*  HCT 25.1* 24.7*  MCV 83.4 83.4  PLT 73* 72*   Basic Metabolic Panel: Recent Labs  Lab 07/20/17 1714  07/20/17 2321 07/21/17 0142 07/21/17 0508 07/21/17 0750 07/22/17 0445  NA  --    < > 116* 115* 118* 120* 126*  K  --    < > 2.9* 2.9* 3.1* 3.4* 3.8  CL  --    < > 79* 80* 84* 86* 94*  CO2  --    < > 25 26 25 24 23   GLUCOSE  --    < > 102* 98 92 92 92  BUN  --    < > 23* 21* 21* 20 16  CREATININE  --    < > 1.53* 1.48* 1.35* 1.38* 1.17  CALCIUM  --    < > 8.5* 8.2* 8.2* 8.4* 8.5*  MG 1.7  --   --   --   --  2.0  --    < > = values in this interval not displayed.   GFR: Estimated Creatinine Clearance: 57.7 mL/min (by C-G formula based on SCr of 1.17 mg/dL). Liver Function Tests: Recent Labs  Lab 07/20/17 1705  AST 52*  ALT 25  ALKPHOS 63  BILITOT 0.9  PROT 5.8*  ALBUMIN 3.3*   No results for input(s): LIPASE, AMYLASE in the last 168 hours. No results for input(s): AMMONIA in the last 168 hours. Coagulation Profile: No results for input(s): INR, PROTIME in the last 168 hours. Cardiac Enzymes: Recent Labs  Lab 07/20/17 1705 07/20/17 2321 07/21/17 0508  TROPONINI 0.04* 0.04* 0.04*   BNP (last 3 results) No results for input(s): PROBNP in the last 8760 hours. HbA1C: No results for input(s): HGBA1C in the last 72 hours. CBG: No results for input(s): GLUCAP in the last 168 hours. Lipid Profile: No results for input(s): CHOL, HDL, LDLCALC, TRIG, CHOLHDL, LDLDIRECT in the last 72 hours. Thyroid Function Tests: Recent Labs    07/20/17 2005  TSH 3.794   Anemia Panel: No results for input(s): VITAMINB12, FOLATE, FERRITIN, TIBC, IRON, RETICCTPCT in the last 72 hours. Sepsis Labs: Recent Labs  Lab  07/20/17 1530 07/20/17 1715  LATICACIDVEN 0.82 1.51    Recent Results (from the past 240 hour(s))  Blood Culture (routine x 2)     Status: None (Preliminary result)   Collection Time: 07/20/17  3:05 PM  Result Value Ref Range Status   Specimen Description BLOOD RIGHT ANTECUBITAL  Final   Special Requests   Final    Blood Culture results may not be optimal due to an inadequate volume of blood received in culture bottles   Culture NO GROWTH 2 DAYS  Final   Report Status PENDING  Incomplete  Blood Culture (routine x 2)     Status: None (Preliminary result)   Collection Time: 07/20/17  3:10 PM  Result Value Ref Range Status   Specimen Description BLOOD PORTA CATH RIGHT CHEST  Final  Special Requests Blood Culture adequate volume  Final   Culture NO GROWTH 2 DAYS  Final   Report Status PENDING  Incomplete  MRSA PCR Screening     Status: None   Collection Time: 07/20/17 10:10 PM  Result Value Ref Range Status   MRSA by PCR NEGATIVE NEGATIVE Final    Comment:        The GeneXpert MRSA Assay (FDA approved for NASAL specimens only), is one component of a comprehensive MRSA colonization surveillance program. It is not intended to diagnose MRSA infection nor to guide or monitor treatment for MRSA infections.          Radiology Studies: Dg Chest 2 View  Result Date: 07/20/2017 CLINICAL DATA:  Weakness EXAM: CHEST  2 VIEW COMPARISON:  06/28/2017 FINDINGS: Mild linear scarring/atelectasis at the right lung base. Left lung is clear. No pleural effusion or pneumothorax. The heart is normal in size. Right IJ chest port terminates in the lower SVC. Degenerative changes of the visualized thoracolumbar spine. IMPRESSION: No evidence of acute cardiopulmonary disease. Electronically Signed   By: Julian Hy M.D.   On: 07/20/2017 15:56   Ct Head Wo Contrast  Result Date: 07/20/2017 CLINICAL DATA:  Generalized weakness for 1 week.  No known injury. EXAM: CT HEAD WITHOUT CONTRAST  TECHNIQUE: Contiguous axial images were obtained from the base of the skull through the vertex without intravenous contrast. COMPARISON:  None. FINDINGS: Brain: Atrophy and chronic microvascular ischemic change are identified. No evidence of acute abnormality including hemorrhage, infarct, mass lesion, mass effect, midline shift or abnormal extra-axial fluid collection. Vascular: Extensive atherosclerosis.  Otherwise unremarkable. Skull: Intact. Sinuses/Orbits: Negative. Other: None. IMPRESSION: No acute abnormality. Atrophy and chronic microvascular ischemic change. Atherosclerosis. Electronically Signed   By: Inge Rise M.D.   On: 07/20/2017 16:11        Scheduled Meds: . tamsulosin  0.4 mg Oral QPC supper   Continuous Infusions: . sodium chloride 75 mL/hr at 07/22/17 0121     LOS: 2 days    Time spent: 51mins    Kathie Dike, MD [facility] Pager [phone]  If 7PM-7AM, please contact night-coverage [email or web address] Password [facility] 07/22/2017, 9:37 AM

## 2017-07-22 NOTE — Care Management Note (Signed)
Case Management Note  Patient Details  Name: Christian Sherman MRN: [id number] Date of Birth: [birth year]  Subjective/Objective:   Adm with Hyponatremia and AKI. Undergoing treatment for penile cancer. From home, lives with daughter. Has cane and RW pta. Has been using cane lately due to falls and progress weakness. Weakness and poor p.o. Intake since starting chemotherapy. No home health.  Daughter drives him to appointments.  He is eligible for [facility] services and is agreeable.        Action/Plan: DC home. CM following for needs. ? HH. Will make referral to [facility].   Expected Discharge Date:    unk             Expected Discharge Plan:  Home/Self Care  In-House Referral:     Discharge planning Services  CM Consult  Post Acute Care Choice:    Choice offered to:     DME Arranged:    DME Agency:     HH Arranged:    HH Agency:     Status of Service:  In process, will continue to follow  If discussed at Long Length of Stay Meetings, dates discussed:    Additional Comments:  Kellan Raffield, Chauncey Reading, RN 07/22/2017, 3:11 PM

## 2017-07-23 DIAGNOSIS — J449 Chronic obstructive pulmonary disease, unspecified: Secondary | ICD-10-CM

## 2017-07-23 DIAGNOSIS — D649 Anemia, unspecified: Secondary | ICD-10-CM

## 2017-07-23 LAB — RENAL FUNCTION PANEL
ANION GAP: 8 (ref 5–15)
Albumin: 3 g/dL — ABNORMAL LOW (ref 3.5–5.0)
BUN: 15 mg/dL (ref 6–20)
CALCIUM: 8.3 mg/dL — AB (ref 8.9–10.3)
CHLORIDE: 97 mmol/L — AB (ref 101–111)
CO2: 23 mmol/L (ref 22–32)
Creatinine, Ser: 1.13 mg/dL (ref 0.61–1.24)
GFR calc non Af Amer: 58 mL/min — ABNORMAL LOW (ref >60)
GLUCOSE: 90 mg/dL (ref 65–99)
POTASSIUM: 3.6 mmol/L (ref 3.5–5.1)
Phosphorus: 1.7 mg/dL — ABNORMAL LOW (ref 2.5–4.6)
SODIUM: 128 mmol/L — AB (ref 135–145)

## 2017-07-23 MED ORDER — HEPARIN SOD (PORK) LOCK FLUSH 100 UNIT/ML IV SOLN
500.0000 [IU] | INTRAVENOUS | Status: DC | PRN
  Administered 2017-07-23: 500 [IU]
  Filled 2017-07-23: qty 5

## 2017-07-23 MED ORDER — FUROSEMIDE 20 MG PO TABS: 20.0000 mg | ORAL_TABLET | Freq: Every day | ORAL | Status: AC | PRN

## 2017-07-23 MED ORDER — LISINOPRIL 20 MG PO TABS: 20.0000 mg | ORAL_TABLET | Freq: Every day | ORAL | 11 refills | Status: AC

## 2017-07-23 NOTE — Progress Notes (Signed)
Physical Therapy Treatment Patient Details Name: Christian Sherman MRN: [id number] DOB: 09/01/1931 Today's Date: 07/23/2017    History of Present Illness Christian Sherman is a 84 y.o. male with medical history significant for penile ca- actively undergoing treatment, COPD, HTN, who presented to the from home with complaints of generalized weakness of 3 days duration, lightheadedness, could not move his legs and several falls over the past 3 days.  Reports hitting his head when he fell at least once.  Patient lives with his daughter.  Daughter-[name], reports poor appetite for 3 weeks since he started chemotherapy paclitaxel for his penile cancer.  Reports over the past 3 days patient has barely eaten anything, but has been drinking at least 4-5  16 ounce bottles of water daily, times more.  Would sometimes drink Gatorade or Sprite.  Patient has been taking all his prescribed medications which include Lasix 20 mg every day, and lisinopril with HCTZ 20/25 mg daily    PT Comments    Patient demonstrates increased endurance/tolerance for gait, no loss of balance, required 1 standing rest break and tolerated sitting up in chair after therapy with family members present in room.  Patient will benefit from continued physical therapy in hospital and recommended venue below to increase strength, balance, endurance for safe ADLs and gait.    Follow Up Recommendations  Home health PT;Supervision for mobility/OOB     Equipment Recommendations  None recommended by PT    Recommendations for Other Services       Precautions / Restrictions Precautions Precautions: Fall Restrictions Weight Bearing Restrictions: No    Mobility  Bed Mobility Overal bed mobility: Needs Assistance Bed Mobility: Supine to Sit     Supine to sit: Supervision        Transfers Overall transfer level: Needs assistance Equipment used: Rolling walker (2 wheeled) Transfers: Sit to/from [employer] Sit to Stand: Min guard Stand pivot transfers: Min guard          Ambulation/Gait Ambulation/Gait assistance: Min guard Ambulation Distance (Feet): 100 Feet Assistive device: Rolling walker (2 wheeled) Gait Pattern/deviations: Decreased step length - right;Decreased step length - left;Decreased stride length   Gait velocity interpretation: Below normal speed for age/gender General Gait Details: Patient demonstrates slightly labored slow cadence without loss of balance, limited secondary to fatigue   Stairs            Wheelchair Mobility    Modified Rankin (Stroke Patients Only)       Balance Overall balance assessment: Needs assistance Sitting-balance support: Bilateral upper extremity supported;Feet supported Sitting balance-Leahy Scale: Good     Standing balance support: Bilateral upper extremity supported;During functional activity Standing balance-Leahy Scale: Fair                              Cognition Arousal/Alertness: Awake/alert Behavior During Therapy: WFL for tasks assessed/performed Overall Cognitive Status: Within Functional Limits for tasks assessed                                        Exercises General Exercises - Lower Extremity Ankle Circles/Pumps: Seated;AROM;Both;10 reps;Strengthening Long Arc Quad: AROM;Seated;Both;10 reps;Strengthening Hip Flexion/Marching: Seated;AROM;Strengthening;Both;10 reps    General Comments        Pertinent Vitals/Pain Pain Assessment: No/denies pain    Home Living  Prior Function            PT Goals (current goals can now be found in the care plan section) Acute Rehab PT Goals Patient Stated Goal: return home with his daughter to assist PT Goal Formulation: With patient/family Time For Goal Achievement: 07/26/17 Potential to Achieve Goals: Good Progress towards PT goals: Progressing toward goals    Frequency    Min  3X/week      PT Plan Current plan remains appropriate    Co-evaluation              AM-PAC PT "6 Clicks" Daily Activity  Outcome Measure  Difficulty turning over in bed (including adjusting bedclothes, sheets and blankets)?: None Difficulty moving from lying on back to sitting on the side of the bed? : None Difficulty sitting down on and standing up from a chair with arms (e.g., wheelchair, bedside commode, etc,.)?: A Little Help needed moving to and from a bed to chair (including a wheelchair)?: A Little Help needed walking in hospital room?: A Little Help needed climbing 3-5 steps with a railing? : A Little 6 Click Score: 20    End of Session Equipment Utilized During Treatment: Gait belt Activity Tolerance: Patient tolerated treatment well;Patient limited by fatigue Patient left: in chair;with call bell/phone within reach;with family/visitor present Nurse Communication: Mobility status PT Visit Diagnosis: Unsteadiness on feet (R26.81);Other abnormalities of gait and mobility (R26.89);Muscle weakness (generalized) (M62.81)     Time: 0160-1093 PT Time Calculation (min) (ACUTE ONLY): 18 min  Charges:  $Therapeutic Activity: 8-22 mins                    G Codes:       2:45 PM, 08/09/17 Lonell Grandchild, MPT Physical Therapist with [facility] [phone] office [phone] mobile phone

## 2017-07-23 NOTE — Care Management Important Message (Signed)
Important Message  Patient Details  Name: SUTTER AHLGREN MRN: [id number] Date of Birth: Aug 31, 1931   Medicare Important Message Given:  Yes    Sherald Barge, RN 07/23/2017, 12:59 PM

## 2017-07-23 NOTE — Progress Notes (Signed)
De accessed chest port-flushed and heparin locked. Reviewed d/c paperwork with pt and daughter. [place] the NT wheeled stable pt and belongings to the main entrance.

## 2017-07-23 NOTE — Care Management Note (Signed)
Case Management Note  Patient Details  Name: TETSUO COPPOLA MRN: [id number] Date of Birth: March 29, 1932  Expected Discharge Date:       07/24/2016           Expected Discharge Plan:  [place]  In-House Referral:  NA  Discharge planning Services  CM Consult  Post Acute Care Choice:  Home Health Choice offered to:  Patient  HH Arranged:  PT [place] Agency:  [place]  Status of Service:  Completed, signed off   Additional Comments: Planning for DC home over weekend. Pt needs HH PT and is agreeable. He has chosen [facility] from list of providers. He is aware HH as 48 hrs to make first visit. Pt's daughter at bedside for DC plan discussion. Jermaine, [facility] rep, aware of referral and will pull pt info from chart.   Sherald Barge, RN 07/23/2017, 12:58 PM

## 2017-07-23 NOTE — Discharge Summary (Signed)
Physician Discharge Summary  Christian Sherman:[id number] DOB: [birth year] DOA: 07/20/2017  PCP: Manon Hilding, MD  Admit date: 07/20/2017 Discharge date: 07/23/2017  Admitted From: Home Disposition: Home  Recommendations for Outpatient Follow-up:  1. Follow up with PCP in 1-2 weeks 2. Please obtain BMP/CBC in one week 3. Hydrochlorothiazide discontinued due to hyponatremia.  Lasix changed to as needed.  Home Health: Home health PT Equipment/Devices:  Discharge Condition: Stable CODE STATUS: Full code Diet recommendation: Heart Healthy  Brief/Interim Summary: Christian Shostak Fitzpatrickis a 84 y.o.malewith medical history significantfor penile ca- actively undergoing treatment, COPD, HTN,who presented to the from home with complaints of generalized weakness of 3 days duration,lightheadedness, couldnotmove his legs and several falls over the past 3 days.Reports hitting his head whenhe fell at least once.Patient lives with his daughter.Daughter-[name],reports poor appetite for 3 weeks since he started chemotherapy paclitaxel for his penile cancer.Reports over the past 3 days patient has barely eaten anything,but has been drinking at least 4-5 16 ounce bottles of water daily,timesmore.Would sometimes drink Gatorade or Sprite.Patient has been taking all his prescribed medications which include Lasix 20 mg every day,and lisinopril with HCTZ 20/25mg daily. Upon admission he denied having any nausea vomiting headaches or seizures. Shortness of breath or chest pain. Daughter reported he has been on Levaquin for low white count, no source of infection. He has not had his chemotherapy last week due to low white count. Upon admission patient's sodium was as low as 111, potassium 2.1, mildly elevated creatinine   Discharge Diagnoses:  Principal Problem:   Hyponatremia Active Problems:   Penile cancer (HCC)   Hypokalemia   COPD (chronic obstructive pulmonary disease)  (HCC)   Essential hypertension   Pancytopenia ([place])  1. Hyponatremia.  Patient had a sodium of 111 on admission.  Felt to be hypovolemic hyponatremia.  Started on intravenous fluids.  Initially was receiving 3% saline, but has since been transitioned to normal saline with improvement of sodium.  Hydrochlorothiazide and Lasix had been held.  Sodium improved to 128.  Patient was asymptomatic.  Review of prior records indicate that baseline sodium runs around 130.  He was advised to continue with free water restriction.  Continue to hold hydrochlorothiazide on discharge.  Lasix changed to as needed.  He will need repeat chemistry in 1-2 weeks. 2. Acute kidney injury superimposed on chronic kidney disease stage II-III.  Creatinine 1.6 on admission.  This is improved to 1.1 today with hydration.  ACE inhibitor as well as diuretics held on admission.    Since renal function is better, will resume ACE inhibitor.  He will need repeat labs in approximately 1-2 weeks. 3. Hypokalemia.  Likely related to diuretics.  Replaced. 4. Hypertension.  Blood pressure trending up.  Resume ACE inhibitor on discharge. 5. Pancytopenia.  Likely related to chemotherapy.  It appears stable. 6. Penile cancer.  Currently on chemotherapy.  Follow-up with oncology. 7. BPH.  Continue on tamsulosin.     Discharge Instructions  Discharge Instructions    AMB Referral to [facility]   Complete by:  As directed    Please assign patient for community nurse to engage for transition of care calls and evaluate for monthly home visits. Patient is extremely [facility] please contact daughter Corrie Dandy at [phone] related to this reason.  For questions please contact:   Janci Minor RN, [place] [facility] Liaison [phone])   Reason for consult:  Post hospital discharge follow up with [facility] RN Community case Manger   Diagnoses  of:  Cancer with Mets   Expected date of contact:  1-3 days (reserved for hospital discharges)    Diet - low sodium heart healthy   Complete by:  As directed    Increase activity slowly   Complete by:  As directed      Allergies as of 07/23/2017      Reactions   Penicillin G Swelling   Arm swelling noted per pt Has patient had a PCN reaction causing immediate rash, facial/tongue/throat swelling, SOB or lightheadedness with hypotension: no Has patient had a PCN reaction causing severe rash involving mucus membranes or skin necrosis: No Has patient had a PCN reaction that required hospitalization: No Has patient had a PCN reaction occurring within the last 10 years: No If all of the above answers are "NO", then may proceed with Cephalosporin use.      Medication List    STOP taking these medications   levofloxacin 500 MG tablet Commonly known as:  LEVAQUIN   lisinopril-hydrochlorothiazide 20-25 MG tablet Commonly known as:  PRINZIDE,ZESTORETIC     TAKE these medications   acetaminophen 650 MG CR tablet Commonly known as:  TYLENOL Take 650 mg by mouth daily as needed for pain.   albuterol 108 (90 Base) MCG/ACT inhaler Commonly known as:  PROVENTIL HFA;VENTOLIN HFA Inhale into the lungs.   aspirin EC 81 MG tablet Take 81 mg by mouth at bedtime.   bacitracin 500 UNIT/GM ointment Apply 1 application topically 2 (two) times daily as needed for wound care.   bisacodyl 5 MG EC tablet Generic drug:  bisacodyl Take 5 mg by mouth daily as needed for mild constipation or moderate constipation.   dexamethasone 4 MG tablet Commonly known as:  DECADRON Take 8 mg by mouth See admin instructions. Take 2 tablets the night before chemo (paclitaxel).   furosemide 20 MG tablet Commonly known as:  LASIX Take 1 tablet (20 mg total) by mouth daily as needed for fluid. What changed:    when to take this  reasons to take this   lisinopril 20 MG tablet Commonly known as:  PRINIVIL,ZESTRIL Take 1 tablet (20 mg total) by mouth daily.   Melatonin 3 MG Tabs Take 6 mg by mouth at  bedtime.   multivitamin Tabs tablet Take by mouth.   ondansetron 8 MG tablet Commonly known as:  ZOFRAN Take 8 mg by mouth every 12 (twelve) hours as needed for nausea/vomiting.   Red Yeast Rice 600 MG Tabs Take 1,800 mg by mouth daily.   tamsulosin 0.4 MG Caps capsule Commonly known as:  FLOMAX Take 0.4 mg by mouth.   Vitamin D3 1000 units Caps Take 1 capsule by mouth daily.      Follow-up Information    [facility] Follow up.   Specialty:  Home Health Services Contact information: [place] [address] [phone]        Manon Hilding, MD. Schedule an appointment as soon as possible for a visit in 1 week(s).   Specialty:  Family Medicine Contact information: [address] [place] [address] [phone]          Allergies  Allergen Reactions  . Penicillin G Swelling    Arm swelling noted per pt Has patient had a PCN reaction causing immediate rash, facial/tongue/throat swelling, SOB or lightheadedness with hypotension: no Has patient had a PCN reaction causing severe rash involving mucus membranes or skin necrosis: No Has patient had a PCN reaction that  required hospitalization: No Has patient had a PCN reaction occurring within the last 10 years: No If all of the above answers are "NO", then may proceed with Cephalosporin use.     Consultations:  Nephrology   Procedures/Studies: Dg Chest 2 View  Result Date: 07/20/2017 CLINICAL DATA:  Weakness EXAM: CHEST  2 VIEW COMPARISON:  06/28/2017 FINDINGS: Mild linear scarring/atelectasis at the right lung base. Left lung is clear. No pleural effusion or pneumothorax. The heart is normal in size. Right IJ chest port terminates in the lower SVC. Degenerative changes of the visualized thoracolumbar spine. IMPRESSION: No evidence of acute cardiopulmonary disease. Electronically Signed   By: Julian Hy M.D.   On: 07/20/2017 15:56   Ct Head Wo Contrast  Result Date:  07/20/2017 CLINICAL DATA:  Generalized weakness for 1 week.  No known injury. EXAM: CT HEAD WITHOUT CONTRAST TECHNIQUE: Contiguous axial images were obtained from the base of the skull through the vertex without intravenous contrast. COMPARISON:  None. FINDINGS: Brain: Atrophy and chronic microvascular ischemic change are identified. No evidence of acute abnormality including hemorrhage, infarct, mass lesion, mass effect, midline shift or abnormal extra-axial fluid collection. Vascular: Extensive atherosclerosis.  Otherwise unremarkable. Skull: Intact. Sinuses/Orbits: Negative. Other: None. IMPRESSION: No acute abnormality. Atrophy and chronic microvascular ischemic change. Atherosclerosis. Electronically Signed   By: Inge Rise M.D.   On: 07/20/2017 16:11       Subjective: Feels well.  No shortness of breath.  No chest pain.  No lightheadedness.  Discharge Exam: Vitals:   07/23/17 0645 07/23/17 1404  BP: (!) 149/70 (!) 157/67  Pulse: 76 73  Resp: 16 16  Temp: 98.3 F (36.8 C) 97.6 F (36.4 C)  SpO2: 98% 100%   Vitals:   07/22/17 2121 07/22/17 2249 07/23/17 0645 07/23/17 1404  BP:  (!) 144/62 (!) 149/70 (!) 157/67  Pulse:  71 76 73  Resp:  18 16 16   Temp:  98.6 F (37 C) 98.3 F (36.8 C) 97.6 F (36.4 C)  TempSrc:    Oral  SpO2: 91% 99% 98% 100%  Weight:      Height:        General: Pt is alert, awake, not in acute distress Cardiovascular: RRR, S1/S2 +, no rubs, no gallops Respiratory: CTA bilaterally, no wheezing, no rhonchi Abdominal: Soft, NT, ND, bowel sounds + Extremities: no edema, no cyanosis    The results of significant diagnostics from this hospitalization (including imaging, microbiology, ancillary and laboratory) are listed below for reference.     Microbiology: Recent Results (from the past 240 hour(s))  Blood Culture (routine x 2)     Status: None (Preliminary result)   Collection Time: 07/20/17  3:05 PM  Result Value Ref Range Status   Specimen  Description BLOOD RIGHT ANTECUBITAL  Final   Special Requests   Final    Blood Culture results may not be optimal due to an inadequate volume of blood received in culture bottles   Culture NO GROWTH 3 DAYS  Final   Report Status PENDING  Incomplete  Blood Culture (routine x 2)     Status: None (Preliminary result)   Collection Time: 07/20/17  3:10 PM  Result Value Ref Range Status   Specimen Description BLOOD PORTA CATH RIGHT CHEST  Final   Special Requests Blood Culture adequate volume  Final   Culture NO GROWTH 3 DAYS  Final   Report Status PENDING  Incomplete  MRSA PCR Screening     Status: None  Collection Time: 07/20/17 10:10 PM  Result Value Ref Range Status   MRSA by PCR NEGATIVE NEGATIVE Final    Comment:        The GeneXpert MRSA Assay (FDA approved for NASAL specimens only), is one component of a comprehensive MRSA colonization surveillance program. It is not intended to diagnose MRSA infection nor to guide or monitor treatment for MRSA infections.      Labs: BNP (last 3 results) No results for input(s): BNP in the last 8760 hours. Basic Metabolic Panel: Recent Labs  Lab 07/20/17 1714  07/21/17 0142 07/21/17 0508 07/21/17 0750 07/22/17 0445 07/23/17 0449  NA  --    < > 115* 118* 120* 126* 128*  K  --    < > 2.9* 3.1* 3.4* 3.8 3.6  CL  --    < > 80* 84* 86* 94* 97*  CO2  --    < > 26 25 24 23 23   GLUCOSE  --    < > 98 92 92 92 90  BUN  --    < > 21* 21* 20 16 15   CREATININE  --    < > 1.48* 1.35* 1.38* 1.17 1.13  CALCIUM  --    < > 8.2* 8.2* 8.4* 8.5* 8.3*  MG 1.7  --   --   --  2.0  --   --   PHOS  --   --   --   --   --   --  1.7*   < > = values in this interval not displayed.   Liver Function Tests: Recent Labs  Lab 07/20/17 1705 07/23/17 0449  AST 52*  --   ALT 25  --   ALKPHOS 63  --   BILITOT 0.9  --   PROT 5.8*  --   ALBUMIN 3.3* 3.0*   No results for input(s): LIPASE, AMYLASE in the last 168 hours. No results for input(s): AMMONIA in  the last 168 hours. CBC: Recent Labs  Lab 07/20/17 1705 07/21/17 0508  WBC 1.7* 1.6*  NEUTROABS 1.0*  --   HGB 9.3* 9.2*  HCT 25.1* 24.7*  MCV 83.4 83.4  PLT 73* 72*   Cardiac Enzymes: Recent Labs  Lab 07/20/17 1705 07/20/17 2321 07/21/17 0508  TROPONINI 0.04* 0.04* 0.04*   BNP: Invalid input(s): POCBNP CBG: No results for input(s): GLUCAP in the last 168 hours. D-Dimer No results for input(s): DDIMER in the last 72 hours. Hgb A1c No results for input(s): HGBA1C in the last 72 hours. Lipid Profile No results for input(s): CHOL, HDL, LDLCALC, TRIG, CHOLHDL, LDLDIRECT in the last 72 hours. Thyroid function studies Recent Labs    07/20/17 2005  TSH 3.794   Anemia work up No results for input(s): VITAMINB12, FOLATE, FERRITIN, TIBC, IRON, RETICCTPCT in the last 72 hours. Urinalysis    Component Value Date/Time   COLORURINE YELLOW 07/20/2017 1505   APPEARANCEUR CLEAR 07/20/2017 1505   LABSPEC 1.006 07/20/2017 1505   PHURINE 7.0 07/20/2017 1505   GLUCOSEU NEGATIVE 07/20/2017 1505   HGBUR NEGATIVE 07/20/2017 1505   BILIRUBINUR NEGATIVE 07/20/2017 1505   KETONESUR NEGATIVE 07/20/2017 1505   PROTEINUR NEGATIVE 07/20/2017 1505   NITRITE NEGATIVE 07/20/2017 1505   LEUKOCYTESUR TRACE (A) 07/20/2017 1505   Sepsis Labs Invalid input(s): PROCALCITONIN,  WBC,  LACTICIDVEN Microbiology Recent Results (from the past 240 hour(s))  Blood Culture (routine x 2)     Status: None (Preliminary result)   Collection Time: 07/20/17  3:05 PM  Result Value Ref Range Status   Specimen Description BLOOD RIGHT ANTECUBITAL  Final   Special Requests   Final    Blood Culture results may not be optimal due to an inadequate volume of blood received in culture bottles   Culture NO GROWTH 3 DAYS  Final   Report Status PENDING  Incomplete  Blood Culture (routine x 2)     Status: None (Preliminary result)   Collection Time: 07/20/17  3:10 PM  Result Value Ref Range Status   Specimen  Description BLOOD PORTA CATH RIGHT CHEST  Final   Special Requests Blood Culture adequate volume  Final   Culture NO GROWTH 3 DAYS  Final   Report Status PENDING  Incomplete  MRSA PCR Screening     Status: None   Collection Time: 07/20/17 10:10 PM  Result Value Ref Range Status   MRSA by PCR NEGATIVE NEGATIVE Final    Comment:        The GeneXpert MRSA Assay (FDA approved for NASAL specimens only), is one component of a comprehensive MRSA colonization surveillance program. It is not intended to diagnose MRSA infection nor to guide or monitor treatment for MRSA infections.      Time coordinating discharge: Over 30 minutes  SIGNED:   Kathie Dike, MD  [facility] 07/23/2017, 7:05 PM Pager   If 7PM-7AM, please contact night-coverage [email or web address] Password [email or web address]

## 2017-07-23 NOTE — Progress Notes (Signed)
Subjective: Interval History: Patient is feeling much better.  He denies any weakness.  His appetite is good and no nausea or vomiting..  Objective: Vital signs in last 24 hours: Temp:  [97.8 F (36.6 C)-98.6 F (37 C)] 98.3 F (36.8 C) (01/11 0645) Pulse Rate:  [45-85] 76 (01/11 0645) Resp:  [16-21] 16 (01/11 0645) BP: (137-194)/(62-85) 149/70 (01/11 0645) SpO2:  [71 %-99 %] 98 % (01/11 0645) Weight:  [107.2 kg (236 lb 5.3 oz)] 107.2 kg (236 lb 5.3 oz) (01/10 1745) Weight change: 0 kg (0 lb)  Intake/Output from previous day: 01/10 0701 - 01/11 0700 In: -  Out: 700 [Urine:700] Intake/Output this shift: No intake/output data recorded.  General appearance: alert, cooperative and no distress Resp: clear to auscultation bilaterally Cardio: regular rate and rhythm Extremities: No edema  Lab Results: Recent Labs    07/20/17 1705 07/21/17 0508  WBC 1.7* 1.6*  HGB 9.3* 9.2*  HCT 25.1* 24.7*  PLT 73* 72*   BMET:  Recent Labs    07/22/17 0445 07/23/17 0449  NA 126* 128*  K 3.8 3.6  CL 94* 97*  CO2 23 23  GLUCOSE 92 90  BUN 16 15  CREATININE 1.17 1.13  CALCIUM 8.5* 8.3*   No results for input(s): PTH in the last 72 hours. Iron Studies: No results for input(s): IRON, TIBC, TRANSFERRIN, FERRITIN in the last 72 hours.  Studies/Results: No results found.  I have reviewed the patient's current medications.  Assessment/Plan: 1] acute kidney injury superimposed on chronic.  Etiology was thought to be secondary to ACE inhibitor/hydrochlorothiazide/prerenal syndrome.  Patient with underlying chronic renal failure possibly stage II/III.Marland Kitchen  Presently his renal function has returned to his baseline. 2] hypokalemia: His potassium is normal 3] hyponatremia: Possibly hypovolemic hyponatremia.  His sodium is 128 and progressively improving.  Patient also denies any dizziness or lightheadedness and overall is symptomatic. 4] hypertension: His blood pressure is reasonably  controlled 5] pancytopenia: Possibly from chemotherapy 6] history of penile cancer. Plan: 1] we will DC IV fluid 2] we will check his renal panel in the morning. 3] continue with free water restriction.    LOS: 3 days   Genevia Bouldin S 07/23/2017,8:43 AM

## 2017-07-23 NOTE — Consult Note (Addendum)
   [facility] CM Inpatient Consult   07/23/2017  Christian Sherman Aug 25, 1931 [id number]   Referral received by inpatient case manager for [place] care Management services. Called and spoke with Lidia Collum the inpatient case manager and she stated the patient and his daughter were agreeable to services. She suggested calling patient's daughter to further discuss services related to patient is extremely hard of hearing.   Noted in chart patient lives with daughter, has had poor PO since chemo and some falls.   Placed a call to daughter's number, VM left will await a call back from daughter.  Able to reach daughter Christian Sherman with a phone call into patient's room, explained [place] Management services. Patient eligible services through his [employer]. Daughter explains her father is extremely hard of hearing and has a hard time understanding people on the phone. She also stated she was the main care giver but her sis ter was coming from [place] to assist. Ms. Sumner Boast stated she works third shift and when you call you may have to leave a message and she will return your call.  Verbal consent recieved. Daughter gave [phone] as the best number to reach her. Patient will receive post hospital discharge calls and be evaluated for monthly home visits. [place] Management services does not interfere with or replace any services arranged by the inpatient care management team. RNCM gave daughter [facility] 24 hour nurse advice line. Made inpatient RNCM aware that [facility] will be following for care management. For additional questions please contact:   Mikhael Hendriks RN, [facility] Liaison  [phone]) Business Mobile [phone]) Toll free office   Charmagne Buhl RN, BSN [facility] Liaison  i [phone]) Business Mobile [phone]) Toll free office

## 2017-07-25 LAB — CULTURE, BLOOD (ROUTINE X 2)
Culture: NO GROWTH
Culture: NO GROWTH
Special Requests: ADEQUATE

## 2017-07-26 ENCOUNTER — Other Ambulatory Visit: Payer: Self-pay | Admitting: *Deleted

## 2017-07-26 NOTE — Patient Outreach (Signed)
Referral received from hospital liason, pt hospitalized 1/8-1/11/19 for weakness, hyponatremia. Pt has history COPD, HTN, penile cancer and currently undergoing chemotherapy.  RN CM instructed to call patient's daughter Corrie Dandy due to pt extremely hard of hearing,  RN CM called [phone] with no answer to telephone and no option to leave voicemail.  PLAN Outreach pt tomorrow  Jacqlyn Larsen [facility], [place] Coordinator [phone]

## 2017-07-27 ENCOUNTER — Other Ambulatory Visit: Payer: Self-pay | Admitting: *Deleted

## 2017-07-27 NOTE — Patient Outreach (Signed)
Referral received hospital liason, pt hospitalized 1/8-1/11/19 with diagnosis hyponatremia, weakness, has history COPD, penile cancer, telephone call to daughter Corrie Dandy, HIPAA verified, Adonis Huguenin reports pt sees [facility] [place] and receives chemotherapy once weekly for 3 weeks and off for one week, Adonis Huguenin states pt does have some dyspnea with exertion and uses his inhaler, reports " we're not letting anyone sick in our home"  Pt is temporarily living with [place] [facility] in [place], [place] in [place] but may be able to come to his home for a visit with RN CM (coordinated when pt goes to [place] to see oncologist)  Home health will be working with pt.  Daughter will call RN CM back to verify which day pt will be seeing oncologist and hopefully home visit can be completed that day.  RN CM faxed transition of care note to primary MD Dr. Consuello Masse.  Outpatient Encounter Medications as of 07/27/2017  Medication Sig  . acetaminophen (TYLENOL) 650 MG CR tablet Take 650 mg by mouth daily as needed for pain.   Marland Kitchen albuterol (PROVENTIL HFA;VENTOLIN HFA) 108 (90 Base) MCG/ACT inhaler Inhale into the lungs.  Marland Kitchen aspirin EC 81 MG tablet Take 81 mg by mouth at bedtime.   . bacitracin 500 UNIT/GM ointment Apply 1 application topically 2 (two) times daily as needed for wound care.   . bisacodyl (BISACODYL) 5 MG EC tablet Take 5 mg by mouth daily as needed for mild constipation or moderate constipation.  . Cholecalciferol (VITAMIN D3) 1000 units CAPS Take 1 capsule by mouth daily.  Marland Kitchen dexamethasone (DECADRON) 4 MG tablet Take 8 mg by mouth See admin instructions. Take 2 tablets the night before chemo (paclitaxel).  . furosemide (LASIX) 20 MG tablet Take 1 tablet (20 mg total) by mouth daily as needed for fluid.  Marland Kitchen lisinopril (PRINIVIL,ZESTRIL) 20 MG tablet Take 1 tablet (20 mg total) by mouth daily.  . Melatonin 3 MG TABS Take 6 mg by mouth at bedtime.   . multivitamin (ONE-A-DAY MEN'S) TABS tablet Take by  mouth.  . ondansetron (ZOFRAN) 8 MG tablet Take 8 mg by mouth every 12 (twelve) hours as needed for nausea/vomiting.  . Red Yeast Rice 600 MG TABS Take 1,800 mg by mouth daily.   . tamsulosin (FLOMAX) 0.4 MG CAPS capsule Take 0.4 mg by mouth.   No facility-administered encounter medications on file as of 07/27/2017.     THN CM Care Plan Problem One     Most Recent Value  Care Plan Problem One  Pt high risk for hospital readmission and complications related to COPD, cancer, electrolyte imbalance  Role Documenting the Problem One  Care Management [place] for Problem One  Active  THN Long Term Goal   Patient and family will verbalize/ demonstrate improved self care to facilitate better outcomes within 60 days.  THN Long Term Goal Start Date  07/27/17  Interventions for Problem One Long Term Goal  RN CM reviewed upcoming MD appointments and importance of attending, reviewed with daughter that bloodwork is due in one week, verified home health has contacted pt, reviewed medications over the telephone, reviewed discharge instructions  THN CM Short Term Goal #1   Pt, daughter will verbalize COPD action plan/ zones within 30 days.  THN CM Short Term Goal #1 Start Date  07/27/17  Interventions for Short Term Goal #1  RN CM reviewed COPD action plan/ zone, importance of calling MD early for change in health status, symptoms.  THN CM Short Term Goal #2   Pt, daughter will verbalize signs, symptoms hyponatremia within 30 days  THN CM Short Term Goal #2 Start Date  07/27/17  Interventions for Short Term Goal #2  RN CM reviewed signs/ symptoms of hyponatremia and importance of recognizing these symptoms and calling MD to report      PLAN Continue weekly transition of care calls  Jacqlyn Larsen [facility], [place] Coordinator [phone]

## 2017-07-28 DIAGNOSIS — J449 Chronic obstructive pulmonary disease, unspecified: Secondary | ICD-10-CM | POA: Diagnosis not present

## 2017-07-28 DIAGNOSIS — I10 Essential (primary) hypertension: Secondary | ICD-10-CM | POA: Diagnosis not present

## 2017-07-28 DIAGNOSIS — R05 Cough: Secondary | ICD-10-CM | POA: Diagnosis not present

## 2017-07-28 DIAGNOSIS — C774 Secondary and unspecified malignant neoplasm of inguinal and lower limb lymph nodes: Secondary | ICD-10-CM | POA: Diagnosis not present

## 2017-07-28 DIAGNOSIS — I700 Atherosclerosis of aorta: Secondary | ICD-10-CM | POA: Diagnosis not present

## 2017-07-28 DIAGNOSIS — M170 Bilateral primary osteoarthritis of knee: Secondary | ICD-10-CM | POA: Diagnosis not present

## 2017-07-28 DIAGNOSIS — E871 Hypo-osmolality and hyponatremia: Secondary | ICD-10-CM | POA: Diagnosis not present

## 2017-07-28 DIAGNOSIS — M47816 Spondylosis without myelopathy or radiculopathy, lumbar region: Secondary | ICD-10-CM | POA: Diagnosis not present

## 2017-07-28 DIAGNOSIS — Z923 Personal history of irradiation: Secondary | ICD-10-CM | POA: Diagnosis not present

## 2017-07-28 DIAGNOSIS — Z880 Allergy status to penicillin: Secondary | ICD-10-CM | POA: Diagnosis not present

## 2017-07-28 DIAGNOSIS — R0600 Dyspnea, unspecified: Secondary | ICD-10-CM | POA: Diagnosis not present

## 2017-07-28 DIAGNOSIS — Z7951 Long term (current) use of inhaled steroids: Secondary | ICD-10-CM | POA: Diagnosis not present

## 2017-07-28 DIAGNOSIS — Z86718 Personal history of other venous thrombosis and embolism: Secondary | ICD-10-CM | POA: Diagnosis not present

## 2017-07-28 DIAGNOSIS — I2510 Atherosclerotic heart disease of native coronary artery without angina pectoris: Secondary | ICD-10-CM | POA: Diagnosis not present

## 2017-07-28 DIAGNOSIS — Z7982 Long term (current) use of aspirin: Secondary | ICD-10-CM | POA: Diagnosis not present

## 2017-07-28 DIAGNOSIS — Z96641 Presence of right artificial hip joint: Secondary | ICD-10-CM | POA: Diagnosis not present

## 2017-07-28 DIAGNOSIS — Z87891 Personal history of nicotine dependence: Secondary | ICD-10-CM | POA: Diagnosis not present

## 2017-07-28 DIAGNOSIS — E785 Hyperlipidemia, unspecified: Secondary | ICD-10-CM | POA: Diagnosis not present

## 2017-07-28 DIAGNOSIS — M5136 Other intervertebral disc degeneration, lumbar region: Secondary | ICD-10-CM | POA: Diagnosis not present

## 2017-07-28 DIAGNOSIS — H9190 Unspecified hearing loss, unspecified ear: Secondary | ICD-10-CM | POA: Diagnosis not present

## 2017-07-28 DIAGNOSIS — N281 Cyst of kidney, acquired: Secondary | ICD-10-CM | POA: Diagnosis not present

## 2017-07-28 DIAGNOSIS — Z9849 Cataract extraction status, unspecified eye: Secondary | ICD-10-CM | POA: Diagnosis not present

## 2017-07-28 DIAGNOSIS — Z79899 Other long term (current) drug therapy: Secondary | ICD-10-CM | POA: Diagnosis not present

## 2017-07-29 DIAGNOSIS — I10 Essential (primary) hypertension: Secondary | ICD-10-CM | POA: Diagnosis not present

## 2017-07-29 DIAGNOSIS — E876 Hypokalemia: Secondary | ICD-10-CM | POA: Diagnosis not present

## 2017-07-29 DIAGNOSIS — E871 Hypo-osmolality and hyponatremia: Secondary | ICD-10-CM | POA: Diagnosis not present

## 2017-07-29 DIAGNOSIS — D61810 Antineoplastic chemotherapy induced pancytopenia: Secondary | ICD-10-CM | POA: Diagnosis not present

## 2017-08-02 DIAGNOSIS — E876 Hypokalemia: Secondary | ICD-10-CM | POA: Diagnosis not present

## 2017-08-02 DIAGNOSIS — R531 Weakness: Secondary | ICD-10-CM | POA: Diagnosis not present

## 2017-08-02 DIAGNOSIS — Z87891 Personal history of nicotine dependence: Secondary | ICD-10-CM | POA: Diagnosis not present

## 2017-08-02 DIAGNOSIS — J449 Chronic obstructive pulmonary disease, unspecified: Secondary | ICD-10-CM | POA: Diagnosis not present

## 2017-08-02 DIAGNOSIS — I129 Hypertensive chronic kidney disease with stage 1 through stage 4 chronic kidney disease, or unspecified chronic kidney disease: Secondary | ICD-10-CM | POA: Diagnosis not present

## 2017-08-02 DIAGNOSIS — E7800 Pure hypercholesterolemia, unspecified: Secondary | ICD-10-CM | POA: Diagnosis not present

## 2017-08-02 DIAGNOSIS — N182 Chronic kidney disease, stage 2 (mild): Secondary | ICD-10-CM | POA: Diagnosis not present

## 2017-08-02 DIAGNOSIS — D61818 Other pancytopenia: Secondary | ICD-10-CM | POA: Diagnosis not present

## 2017-08-02 DIAGNOSIS — Z9181 History of falling: Secondary | ICD-10-CM | POA: Diagnosis not present

## 2017-08-02 DIAGNOSIS — D649 Anemia, unspecified: Secondary | ICD-10-CM | POA: Diagnosis not present

## 2017-08-02 DIAGNOSIS — E871 Hypo-osmolality and hyponatremia: Secondary | ICD-10-CM | POA: Diagnosis not present

## 2017-08-02 DIAGNOSIS — Z9221 Personal history of antineoplastic chemotherapy: Secondary | ICD-10-CM | POA: Diagnosis not present

## 2017-08-04 ENCOUNTER — Other Ambulatory Visit: Payer: Self-pay | Admitting: *Deleted

## 2017-08-04 DIAGNOSIS — Z9221 Personal history of antineoplastic chemotherapy: Secondary | ICD-10-CM | POA: Diagnosis not present

## 2017-08-04 DIAGNOSIS — Z87891 Personal history of nicotine dependence: Secondary | ICD-10-CM | POA: Diagnosis not present

## 2017-08-04 DIAGNOSIS — R531 Weakness: Secondary | ICD-10-CM | POA: Diagnosis not present

## 2017-08-04 DIAGNOSIS — J449 Chronic obstructive pulmonary disease, unspecified: Secondary | ICD-10-CM | POA: Diagnosis not present

## 2017-08-04 DIAGNOSIS — D649 Anemia, unspecified: Secondary | ICD-10-CM | POA: Diagnosis not present

## 2017-08-04 DIAGNOSIS — Z9181 History of falling: Secondary | ICD-10-CM | POA: Diagnosis not present

## 2017-08-04 DIAGNOSIS — D61818 Other pancytopenia: Secondary | ICD-10-CM | POA: Diagnosis not present

## 2017-08-04 DIAGNOSIS — N182 Chronic kidney disease, stage 2 (mild): Secondary | ICD-10-CM | POA: Diagnosis not present

## 2017-08-04 DIAGNOSIS — E876 Hypokalemia: Secondary | ICD-10-CM | POA: Diagnosis not present

## 2017-08-04 DIAGNOSIS — I129 Hypertensive chronic kidney disease with stage 1 through stage 4 chronic kidney disease, or unspecified chronic kidney disease: Secondary | ICD-10-CM | POA: Diagnosis not present

## 2017-08-04 DIAGNOSIS — E7800 Pure hypercholesterolemia, unspecified: Secondary | ICD-10-CM | POA: Diagnosis not present

## 2017-08-04 DIAGNOSIS — E871 Hypo-osmolality and hyponatremia: Secondary | ICD-10-CM | POA: Diagnosis not present

## 2017-08-04 NOTE — Patient Outreach (Addendum)
Telephone call to pt for transition of care week 2 and to follow up on tentative initial home visit today, spoke with daughter Corrie Dandy who reports pt will be coming to [facility] today for appointment at cancer center and may be receiving chemotherapy so this will not be a good day to complete home visit.  Adonis Huguenin reports pt will continue staying with her in [place] and does not have any plans to return to his home any time soon, also states this is difficult for pt to go to appointments then come to his house in [place] requests pt stay in transition of care program and would like a home visit in [place] will discuss with Surveyor, quantity and let daughter know if visit can be completed as this location is [facility].  Adonis Huguenin reports home health PT is working with pt, pt saw primary care on 07/29/17 and had bloodwork done, has all medications and taking as prescribed. RN CM called patient's daughter back to inform one home visit can be made in [facility], no answer to telephone, left voicemail requesting return phone call.  THN CM Care Plan Problem One     Most Recent Value  Care Plan Problem One  Pt high risk for hospital readmission and complications related to COPD, cancer, electrolyte imbalance  Role Documenting the Problem One  Care Management [place] for Problem One  Active  THN Long Term Goal   Patient and family will verbalize/ demonstrate improved self care to facilitate better outcomes within 60 days.  THN Long Term Goal Start Date  07/27/17  Interventions for Problem One Long Term Goal  Pt did see primary MD on 1/17 and had bloodwork completed per daughter, home health is working with pt,, pt has all medications and taking as prescribed per daughter.  THN CM Short Term Goal #1   Pt, daughter will verbalize COPD action plan/ zones within 30 days.  THN CM Short Term Goal #1 Start Date  07/27/17  Interventions for Short Term Goal #1  RN CM reinforced COPD  action plan/ zone, importance of calling MD early for change in health status, symptoms.  THN CM Short Term Goal #2   Pt, daughter will verbalize signs, symptoms hyponatremia within 30 days  THN CM Short Term Goal #2 Start Date  07/27/17  Interventions for Short Term Goal #2  RN CM reinforced signs/ symptoms of hyponatremia and importance of recognizing these symptoms and calling MD to report      PLAN Continue weekly transition of care calls  Jacqlyn Larsen [facility], [place] Coordinator [phone]

## 2017-08-05 DIAGNOSIS — Z5111 Encounter for antineoplastic chemotherapy: Secondary | ICD-10-CM | POA: Diagnosis not present

## 2017-08-06 ENCOUNTER — Other Ambulatory Visit: Payer: Self-pay | Admitting: *Deleted

## 2017-08-06 DIAGNOSIS — D61818 Other pancytopenia: Secondary | ICD-10-CM | POA: Diagnosis not present

## 2017-08-06 DIAGNOSIS — R531 Weakness: Secondary | ICD-10-CM | POA: Diagnosis not present

## 2017-08-06 DIAGNOSIS — Z87891 Personal history of nicotine dependence: Secondary | ICD-10-CM | POA: Diagnosis not present

## 2017-08-06 DIAGNOSIS — Z9181 History of falling: Secondary | ICD-10-CM | POA: Diagnosis not present

## 2017-08-06 DIAGNOSIS — I129 Hypertensive chronic kidney disease with stage 1 through stage 4 chronic kidney disease, or unspecified chronic kidney disease: Secondary | ICD-10-CM | POA: Diagnosis not present

## 2017-08-06 DIAGNOSIS — E7800 Pure hypercholesterolemia, unspecified: Secondary | ICD-10-CM | POA: Diagnosis not present

## 2017-08-06 DIAGNOSIS — D649 Anemia, unspecified: Secondary | ICD-10-CM | POA: Diagnosis not present

## 2017-08-06 DIAGNOSIS — E871 Hypo-osmolality and hyponatremia: Secondary | ICD-10-CM | POA: Diagnosis not present

## 2017-08-06 DIAGNOSIS — J449 Chronic obstructive pulmonary disease, unspecified: Secondary | ICD-10-CM | POA: Diagnosis not present

## 2017-08-06 DIAGNOSIS — Z9221 Personal history of antineoplastic chemotherapy: Secondary | ICD-10-CM | POA: Diagnosis not present

## 2017-08-06 DIAGNOSIS — E876 Hypokalemia: Secondary | ICD-10-CM | POA: Diagnosis not present

## 2017-08-06 DIAGNOSIS — N182 Chronic kidney disease, stage 2 (mild): Secondary | ICD-10-CM | POA: Diagnosis not present

## 2017-08-06 NOTE — Patient Outreach (Signed)
Telephone call to patient to schedule initial home visit, permission given by [facility] assistant director Bary Castilla to complete one home visit at patient's daughter's home in [place], [place] [facility]) and then pt to be managed telephonically, spoke with patient's daughter Corrie Dandy, HIPAA verified, initial home visit scheduled for next week.  PLAN See pt for initial home visit next week  Jacqlyn Larsen [facility], [place] Coordinator [phone]

## 2017-08-09 DIAGNOSIS — J449 Chronic obstructive pulmonary disease, unspecified: Secondary | ICD-10-CM | POA: Diagnosis not present

## 2017-08-09 DIAGNOSIS — Z9221 Personal history of antineoplastic chemotherapy: Secondary | ICD-10-CM | POA: Diagnosis not present

## 2017-08-09 DIAGNOSIS — I129 Hypertensive chronic kidney disease with stage 1 through stage 4 chronic kidney disease, or unspecified chronic kidney disease: Secondary | ICD-10-CM | POA: Diagnosis not present

## 2017-08-09 DIAGNOSIS — E7800 Pure hypercholesterolemia, unspecified: Secondary | ICD-10-CM | POA: Diagnosis not present

## 2017-08-09 DIAGNOSIS — R531 Weakness: Secondary | ICD-10-CM | POA: Diagnosis not present

## 2017-08-09 DIAGNOSIS — E876 Hypokalemia: Secondary | ICD-10-CM | POA: Diagnosis not present

## 2017-08-09 DIAGNOSIS — D649 Anemia, unspecified: Secondary | ICD-10-CM | POA: Diagnosis not present

## 2017-08-09 DIAGNOSIS — D61818 Other pancytopenia: Secondary | ICD-10-CM | POA: Diagnosis not present

## 2017-08-09 DIAGNOSIS — Z9181 History of falling: Secondary | ICD-10-CM | POA: Diagnosis not present

## 2017-08-09 DIAGNOSIS — N182 Chronic kidney disease, stage 2 (mild): Secondary | ICD-10-CM | POA: Diagnosis not present

## 2017-08-09 DIAGNOSIS — E871 Hypo-osmolality and hyponatremia: Secondary | ICD-10-CM | POA: Diagnosis not present

## 2017-08-09 DIAGNOSIS — Z87891 Personal history of nicotine dependence: Secondary | ICD-10-CM | POA: Diagnosis not present

## 2017-08-11 ENCOUNTER — Encounter: Payer: Self-pay | Admitting: *Deleted

## 2017-08-11 ENCOUNTER — Other Ambulatory Visit: Payer: Self-pay | Admitting: *Deleted

## 2017-08-11 NOTE — Patient Outreach (Signed)
[facility]) Care Management   08/11/2017  JAMION CARTER 12-03-1931 [id number]  Christian Sherman is an 84 y.o. male  Subjective: Initial home visit with pt, HIPAA verified, daughter present, pt is living with his daughter in [place], continues with chemotherapy, bloodwork weekly at cancer center in [place] well, sleeping well, working with home health PT, daughter states " he's very well taken care of".  Objective:   Vitals:   08/11/17 1238  BP: 138/62  Pulse: 61  Resp: 18  SpO2: 96%  Weight: 245 lb (111.1 kg)  Height: 1.753 m (5\' 9" )   ROS  Physical Exam  Constitutional: He is oriented to person, place, and time. He appears well-developed and well-nourished.  HENT:  Head: Normocephalic.  Neck: Normal range of motion. Neck supple.  Cardiovascular: Normal rate.  Respiratory: Effort normal and breath sounds normal.  GI: Soft. Bowel sounds are normal.  Musculoskeletal: Normal range of motion. He exhibits edema.  1+ edema lower extremities bil  Neurological: He is alert and oriented to person, place, and time.  Skin: Skin is warm and dry.  portacath to right upper chest area  Psychiatric: He has a normal mood and affect. His behavior is normal. Thought content normal.  Slight confusion at times    Encounter Medications:   Outpatient Encounter Medications as of 08/11/2017  Medication Sig  . acetaminophen (TYLENOL) 650 MG CR tablet Take 650 mg by mouth daily as needed for pain.   Marland Kitchen albuterol (PROVENTIL HFA;VENTOLIN HFA) 108 (90 Base) MCG/ACT inhaler Inhale into the lungs.  Marland Kitchen aspirin EC 81 MG tablet Take 81 mg by mouth at bedtime.   . bacitracin 500 UNIT/GM ointment Apply 1 application topically 2 (two) times daily as needed for wound care.   . bisacodyl (BISACODYL) 5 MG EC tablet Take 5 mg by mouth daily as needed for mild constipation or moderate constipation.  . Cholecalciferol (VITAMIN D3) 1000 units CAPS Take 1 capsule by mouth  daily.  Marland Kitchen dexamethasone (DECADRON) 4 MG tablet Take 8 mg by mouth See admin instructions. Take 2 tablets the night before chemo (paclitaxel).  . furosemide (LASIX) 20 MG tablet Take 1 tablet (20 mg total) by mouth daily as needed for fluid.  Marland Kitchen lisinopril (PRINIVIL,ZESTRIL) 20 MG tablet Take 1 tablet (20 mg total) by mouth daily.  . Melatonin 3 MG TABS Take 6 mg by mouth at bedtime.   . multivitamin (ONE-A-DAY MEN'S) TABS tablet Take by mouth.  . ondansetron (ZOFRAN) 8 MG tablet Take 8 mg by mouth every 12 (twelve) hours as needed for nausea/vomiting.  . Red Yeast Rice 600 MG TABS Take 1,800 mg by mouth daily.   . tamsulosin (FLOMAX) 0.4 MG CAPS capsule Take 0.4 mg by mouth.   No facility-administered encounter medications on file as of 08/11/2017.     Functional Status:   In your present state of health, do you have any difficulty performing the following activities: 08/11/2017 07/20/2017  Hearing? Tempie Donning  Vision? N N  Difficulty concentrating or making decisions? Y Y  Comment slight confusion at times per daughter -  Walking or climbing stairs? Y Y  Dressing or bathing? Y N  Comment daughter assists with bath or stays nearby -  Doing errands, shopping? Tempie Donning  Preparing Food and eating ? Y -  Using the Toilet? N -  In the past six months, have you accidently leaked urine? N -  Do you have problems with loss of bowel control? N -  Managing your Medications? Y -  Comment daughter assists -  Managing your Finances? N -  Housekeeping or managing your Housekeeping? Y -  Some recent data might be hidden    Fall/Depression Screening:    Fall Risk  08/11/2017  Falls in the past year? Yes  Number falls in past yr: 2 or more  Injury with Fall? No  Risk Factor Category  High Fall Risk  Risk for fall due to : History of fall(s);Medication side effect  Follow up Falls evaluation completed;Education provided   No flowsheet data found.  Assessment:  Pt and daughter report pt has had no  hospitalizations for COPD and feel most important concern now is new diagnosis cancer "and getting through that"  Pt would like to at some time in the future move back to his house in [place] after completing chemotherapy which may be through April 2019.  RN CM observed medication bottles and reviewed all medications with pt and daughter.  RN CM faxed barrier letter and initial home visit to primary MD Dr. Quintin Alto.  THN CM Care Plan Problem One     Most Recent Value  Care Plan Problem One  Pt high risk for hospital readmission and complications related to COPD, cancer, electrolyte imbalance  Role Documenting the Problem One  Care Management [place] for Problem One  Active  THN Long Term Goal   Patient and family will verbalize/ demonstrate improved self care to facilitate better outcomes within 60 days.  THN Long Term Goal Start Date  07/27/17  Interventions for Problem One Long Term Goal  RN CM verified home health continues working with pt, gave pt [facility] calendar, EMMI handouts and reviewed all with pt and daughter, gave 24 hour nurse line magnet, daughter verifies they work with cancer center in [place] on any needed resources and states " he's got everything he needs"  THN CM Short Term Goal #1   Pt, daughter will verbalize COPD action plan/ zones within 30 days.  THN CM Short Term Goal #1 Start Date  07/27/17  Interventions for Short Term Goal #1  RN CM gave copy of and reviewed COPD action plan/ zone, importance of calling MD early for change in health status, symptoms.  THN CM Short Term Goal #2   Pt, daughter will verbalize signs, symptoms hyponatremia within 30 days  THN CM Short Term Goal #2 Start Date  07/27/17  Interventions for Short Term Goal #2  RN CM reviewed signs/ symptoms of hyponatremia and importance of recognizing these symptoms and calling MD to report  THN CM Short Term Goal #3  Pt, daughter will verbalize signs/ symptoms infection within 30 days  THN CM Short Term Goal  #3 Start Date  08/11/17  Interventions for Short Tern Goal #3  RN CM reviewed signs/ symptoms infection and importance of avoiding sick persons, good handwashing, correlation of chemotherapy to weakened immune system      Plan: call pt for telephone in February Continue weekly transition of care calls  Jacqlyn Larsen [facility], [place] Coordinator [phone]

## 2017-08-12 DIAGNOSIS — D649 Anemia, unspecified: Secondary | ICD-10-CM | POA: Diagnosis not present

## 2017-08-12 DIAGNOSIS — E7800 Pure hypercholesterolemia, unspecified: Secondary | ICD-10-CM | POA: Diagnosis not present

## 2017-08-12 DIAGNOSIS — D61818 Other pancytopenia: Secondary | ICD-10-CM | POA: Diagnosis not present

## 2017-08-12 DIAGNOSIS — E785 Hyperlipidemia, unspecified: Secondary | ICD-10-CM | POA: Diagnosis not present

## 2017-08-12 DIAGNOSIS — M1991 Primary osteoarthritis, unspecified site: Secondary | ICD-10-CM | POA: Diagnosis not present

## 2017-08-12 DIAGNOSIS — C7989 Secondary malignant neoplasm of other specified sites: Secondary | ICD-10-CM | POA: Diagnosis not present

## 2017-08-12 DIAGNOSIS — H9193 Unspecified hearing loss, bilateral: Secondary | ICD-10-CM | POA: Diagnosis not present

## 2017-08-12 DIAGNOSIS — Z87891 Personal history of nicotine dependence: Secondary | ICD-10-CM | POA: Diagnosis not present

## 2017-08-12 DIAGNOSIS — I700 Atherosclerosis of aorta: Secondary | ICD-10-CM | POA: Diagnosis not present

## 2017-08-12 DIAGNOSIS — E876 Hypokalemia: Secondary | ICD-10-CM | POA: Diagnosis not present

## 2017-08-12 DIAGNOSIS — I129 Hypertensive chronic kidney disease with stage 1 through stage 4 chronic kidney disease, or unspecified chronic kidney disease: Secondary | ICD-10-CM | POA: Diagnosis not present

## 2017-08-12 DIAGNOSIS — M170 Bilateral primary osteoarthritis of knee: Secondary | ICD-10-CM | POA: Diagnosis not present

## 2017-08-12 DIAGNOSIS — J439 Emphysema, unspecified: Secondary | ICD-10-CM | POA: Diagnosis not present

## 2017-08-12 DIAGNOSIS — N182 Chronic kidney disease, stage 2 (mild): Secondary | ICD-10-CM | POA: Diagnosis not present

## 2017-08-12 DIAGNOSIS — I10 Essential (primary) hypertension: Secondary | ICD-10-CM | POA: Diagnosis not present

## 2017-08-12 DIAGNOSIS — R531 Weakness: Secondary | ICD-10-CM | POA: Diagnosis not present

## 2017-08-12 DIAGNOSIS — E871 Hypo-osmolality and hyponatremia: Secondary | ICD-10-CM | POA: Diagnosis not present

## 2017-08-12 DIAGNOSIS — Z9221 Personal history of antineoplastic chemotherapy: Secondary | ICD-10-CM | POA: Diagnosis not present

## 2017-08-12 DIAGNOSIS — R948 Abnormal results of function studies of other organs and systems: Secondary | ICD-10-CM | POA: Diagnosis not present

## 2017-08-12 DIAGNOSIS — Z9181 History of falling: Secondary | ICD-10-CM | POA: Diagnosis not present

## 2017-08-12 DIAGNOSIS — J449 Chronic obstructive pulmonary disease, unspecified: Secondary | ICD-10-CM | POA: Diagnosis not present

## 2017-08-12 DIAGNOSIS — M1611 Unilateral primary osteoarthritis, right hip: Secondary | ICD-10-CM | POA: Diagnosis not present

## 2017-08-13 DIAGNOSIS — J449 Chronic obstructive pulmonary disease, unspecified: Secondary | ICD-10-CM | POA: Diagnosis not present

## 2017-08-13 DIAGNOSIS — Z9221 Personal history of antineoplastic chemotherapy: Secondary | ICD-10-CM | POA: Diagnosis not present

## 2017-08-13 DIAGNOSIS — D61818 Other pancytopenia: Secondary | ICD-10-CM | POA: Diagnosis not present

## 2017-08-13 DIAGNOSIS — E7800 Pure hypercholesterolemia, unspecified: Secondary | ICD-10-CM | POA: Diagnosis not present

## 2017-08-13 DIAGNOSIS — Z9181 History of falling: Secondary | ICD-10-CM | POA: Diagnosis not present

## 2017-08-13 DIAGNOSIS — D649 Anemia, unspecified: Secondary | ICD-10-CM | POA: Diagnosis not present

## 2017-08-13 DIAGNOSIS — N182 Chronic kidney disease, stage 2 (mild): Secondary | ICD-10-CM | POA: Diagnosis not present

## 2017-08-13 DIAGNOSIS — I129 Hypertensive chronic kidney disease with stage 1 through stage 4 chronic kidney disease, or unspecified chronic kidney disease: Secondary | ICD-10-CM | POA: Diagnosis not present

## 2017-08-13 DIAGNOSIS — E871 Hypo-osmolality and hyponatremia: Secondary | ICD-10-CM | POA: Diagnosis not present

## 2017-08-13 DIAGNOSIS — Z87891 Personal history of nicotine dependence: Secondary | ICD-10-CM | POA: Diagnosis not present

## 2017-08-13 DIAGNOSIS — E876 Hypokalemia: Secondary | ICD-10-CM | POA: Diagnosis not present

## 2017-08-13 DIAGNOSIS — R531 Weakness: Secondary | ICD-10-CM | POA: Diagnosis not present

## 2017-08-16 DIAGNOSIS — Z9221 Personal history of antineoplastic chemotherapy: Secondary | ICD-10-CM | POA: Diagnosis not present

## 2017-08-16 DIAGNOSIS — D61818 Other pancytopenia: Secondary | ICD-10-CM | POA: Diagnosis not present

## 2017-08-16 DIAGNOSIS — R531 Weakness: Secondary | ICD-10-CM | POA: Diagnosis not present

## 2017-08-16 DIAGNOSIS — D649 Anemia, unspecified: Secondary | ICD-10-CM | POA: Diagnosis not present

## 2017-08-16 DIAGNOSIS — N182 Chronic kidney disease, stage 2 (mild): Secondary | ICD-10-CM | POA: Diagnosis not present

## 2017-08-16 DIAGNOSIS — E876 Hypokalemia: Secondary | ICD-10-CM | POA: Diagnosis not present

## 2017-08-16 DIAGNOSIS — Z87891 Personal history of nicotine dependence: Secondary | ICD-10-CM | POA: Diagnosis not present

## 2017-08-16 DIAGNOSIS — N183 Chronic kidney disease, stage 3 (moderate): Secondary | ICD-10-CM | POA: Diagnosis not present

## 2017-08-16 DIAGNOSIS — E7800 Pure hypercholesterolemia, unspecified: Secondary | ICD-10-CM | POA: Diagnosis not present

## 2017-08-16 DIAGNOSIS — I129 Hypertensive chronic kidney disease with stage 1 through stage 4 chronic kidney disease, or unspecified chronic kidney disease: Secondary | ICD-10-CM | POA: Diagnosis not present

## 2017-08-16 DIAGNOSIS — Z9181 History of falling: Secondary | ICD-10-CM | POA: Diagnosis not present

## 2017-08-16 DIAGNOSIS — E871 Hypo-osmolality and hyponatremia: Secondary | ICD-10-CM | POA: Diagnosis not present

## 2017-08-16 DIAGNOSIS — I10 Essential (primary) hypertension: Secondary | ICD-10-CM | POA: Diagnosis not present

## 2017-08-16 DIAGNOSIS — J449 Chronic obstructive pulmonary disease, unspecified: Secondary | ICD-10-CM | POA: Diagnosis not present

## 2017-08-17 ENCOUNTER — Other Ambulatory Visit: Payer: Self-pay | Admitting: *Deleted

## 2017-08-17 NOTE — Patient Outreach (Signed)
Telephone call to pt for transition of care week 4, spoke with patient's daughter Christian Sherman who reports " he's doing really well", tolerating chemotherapy well, appetite is good, taking medications as prescribed.  No new concerns or issues reported.  THN CM Care Plan Problem One     Most Recent Value  Care Plan Problem One  Pt high risk for hospital readmission and complications related to COPD, cancer, electrolyte imbalance  Role Documenting the Problem One  Care Management [place] for Problem One  Active  THN Long Term Goal   Patient and family will verbalize/ demonstrate improved self care to facilitate better outcomes within 60 days.  THN Long Term Goal Start Date  07/27/17  Interventions for Problem One Long Term Goal  RN CM talked with patient's daughter Christian Sherman about pt tolerating chemotherapy well on 08/12/17, continues to have bloodwork weekly, had bloodwork at primary care MD yesterday and is attending all scheduled appointments  THN CM Short Term Goal #1   Pt, daughter will verbalize COPD action plan/ zones within 30 days.  THN CM Short Term Goal #1 Start Date  07/27/17  Interventions for Short Term Goal #1  RN CM reinforced COPD action plan/ zone, importance of calling MD early for change in health status, symptoms.  THN CM Short Term Goal #2   Pt, daughter will verbalize signs, symptoms hyponatremia within 30 days  THN CM Short Term Goal #2 Start Date  07/27/17  Interventions for Short Term Goal #2  RN CM reinforced signs/ symptoms of hyponatremia and importance of recognizing these symptoms and calling MD to report  THN CM Short Term Goal #3  Pt, daughter will verbalize signs/ symptoms infection within 30 days  THN CM Short Term Goal #3 Start Date  08/11/17  Interventions for Short Tern Goal #3  RN CM reinforced signs/ symptoms infection and importance of avoiding sick persons, good handwashing, correlation of chemotherapy to weakened immune system      PLAN Complete  telephone assessment end of February  Christian Sherman [facility], [place] Coordinator [phone]  f

## 2017-08-18 DIAGNOSIS — H9193 Unspecified hearing loss, bilateral: Secondary | ICD-10-CM | POA: Diagnosis not present

## 2017-08-18 DIAGNOSIS — J449 Chronic obstructive pulmonary disease, unspecified: Secondary | ICD-10-CM | POA: Diagnosis not present

## 2017-08-18 DIAGNOSIS — I10 Essential (primary) hypertension: Secondary | ICD-10-CM | POA: Diagnosis not present

## 2017-08-18 DIAGNOSIS — Z923 Personal history of irradiation: Secondary | ICD-10-CM | POA: Diagnosis not present

## 2017-08-18 DIAGNOSIS — Z9221 Personal history of antineoplastic chemotherapy: Secondary | ICD-10-CM | POA: Diagnosis not present

## 2017-08-19 DIAGNOSIS — Z5111 Encounter for antineoplastic chemotherapy: Secondary | ICD-10-CM | POA: Diagnosis not present

## 2017-08-20 DIAGNOSIS — J438 Other emphysema: Secondary | ICD-10-CM | POA: Diagnosis not present

## 2017-08-20 DIAGNOSIS — D649 Anemia, unspecified: Secondary | ICD-10-CM | POA: Diagnosis not present

## 2017-08-20 DIAGNOSIS — I10 Essential (primary) hypertension: Secondary | ICD-10-CM | POA: Diagnosis not present

## 2017-08-20 DIAGNOSIS — E876 Hypokalemia: Secondary | ICD-10-CM | POA: Diagnosis not present

## 2017-08-20 DIAGNOSIS — Z87891 Personal history of nicotine dependence: Secondary | ICD-10-CM | POA: Diagnosis not present

## 2017-08-20 DIAGNOSIS — D61810 Antineoplastic chemotherapy induced pancytopenia: Secondary | ICD-10-CM | POA: Diagnosis not present

## 2017-08-20 DIAGNOSIS — N183 Chronic kidney disease, stage 3 (moderate): Secondary | ICD-10-CM | POA: Diagnosis not present

## 2017-08-20 DIAGNOSIS — I129 Hypertensive chronic kidney disease with stage 1 through stage 4 chronic kidney disease, or unspecified chronic kidney disease: Secondary | ICD-10-CM | POA: Diagnosis not present

## 2017-08-20 DIAGNOSIS — R531 Weakness: Secondary | ICD-10-CM | POA: Diagnosis not present

## 2017-08-20 DIAGNOSIS — D61818 Other pancytopenia: Secondary | ICD-10-CM | POA: Diagnosis not present

## 2017-08-20 DIAGNOSIS — E871 Hypo-osmolality and hyponatremia: Secondary | ICD-10-CM | POA: Diagnosis not present

## 2017-08-20 DIAGNOSIS — Z9181 History of falling: Secondary | ICD-10-CM | POA: Diagnosis not present

## 2017-08-20 DIAGNOSIS — J449 Chronic obstructive pulmonary disease, unspecified: Secondary | ICD-10-CM | POA: Diagnosis not present

## 2017-08-20 DIAGNOSIS — N182 Chronic kidney disease, stage 2 (mild): Secondary | ICD-10-CM | POA: Diagnosis not present

## 2017-08-20 DIAGNOSIS — Z9221 Personal history of antineoplastic chemotherapy: Secondary | ICD-10-CM | POA: Diagnosis not present

## 2017-08-20 DIAGNOSIS — E7800 Pure hypercholesterolemia, unspecified: Secondary | ICD-10-CM | POA: Diagnosis not present

## 2017-08-22 DIAGNOSIS — I10 Essential (primary) hypertension: Secondary | ICD-10-CM | POA: Diagnosis not present

## 2017-08-22 DIAGNOSIS — E876 Hypokalemia: Secondary | ICD-10-CM | POA: Diagnosis not present

## 2017-08-22 DIAGNOSIS — E871 Hypo-osmolality and hyponatremia: Secondary | ICD-10-CM | POA: Diagnosis not present

## 2017-08-22 DIAGNOSIS — D61810 Antineoplastic chemotherapy induced pancytopenia: Secondary | ICD-10-CM | POA: Diagnosis not present

## 2017-08-26 DIAGNOSIS — Z5111 Encounter for antineoplastic chemotherapy: Secondary | ICD-10-CM | POA: Diagnosis not present

## 2017-08-31 DIAGNOSIS — I10 Essential (primary) hypertension: Secondary | ICD-10-CM | POA: Diagnosis not present

## 2017-08-31 DIAGNOSIS — Z880 Allergy status to penicillin: Secondary | ICD-10-CM | POA: Diagnosis not present

## 2017-08-31 DIAGNOSIS — R600 Localized edema: Secondary | ICD-10-CM | POA: Diagnosis not present

## 2017-08-31 DIAGNOSIS — C774 Secondary and unspecified malignant neoplasm of inguinal and lower limb lymph nodes: Secondary | ICD-10-CM | POA: Diagnosis not present

## 2017-08-31 DIAGNOSIS — M1611 Unilateral primary osteoarthritis, right hip: Secondary | ICD-10-CM | POA: Diagnosis not present

## 2017-08-31 DIAGNOSIS — E871 Hypo-osmolality and hyponatremia: Secondary | ICD-10-CM | POA: Diagnosis not present

## 2017-08-31 DIAGNOSIS — J439 Emphysema, unspecified: Secondary | ICD-10-CM | POA: Diagnosis not present

## 2017-08-31 DIAGNOSIS — Z515 Encounter for palliative care: Secondary | ICD-10-CM | POA: Diagnosis not present

## 2017-08-31 DIAGNOSIS — I700 Atherosclerosis of aorta: Secondary | ICD-10-CM | POA: Diagnosis not present

## 2017-08-31 DIAGNOSIS — M170 Bilateral primary osteoarthritis of knee: Secondary | ICD-10-CM | POA: Diagnosis not present

## 2017-08-31 DIAGNOSIS — Z96641 Presence of right artificial hip joint: Secondary | ICD-10-CM | POA: Diagnosis not present

## 2017-08-31 DIAGNOSIS — E785 Hyperlipidemia, unspecified: Secondary | ICD-10-CM | POA: Diagnosis not present

## 2017-08-31 DIAGNOSIS — H9193 Unspecified hearing loss, bilateral: Secondary | ICD-10-CM | POA: Diagnosis not present

## 2017-08-31 DIAGNOSIS — Z5111 Encounter for antineoplastic chemotherapy: Secondary | ICD-10-CM | POA: Diagnosis not present

## 2017-08-31 DIAGNOSIS — R590 Localized enlarged lymph nodes: Secondary | ICD-10-CM | POA: Diagnosis not present

## 2017-09-02 DIAGNOSIS — Z5111 Encounter for antineoplastic chemotherapy: Secondary | ICD-10-CM | POA: Diagnosis not present

## 2017-09-06 ENCOUNTER — Other Ambulatory Visit: Payer: Self-pay | Admitting: *Deleted

## 2017-09-06 NOTE — Patient Outreach (Signed)
Telephone call to patient's daughter Adonis Huguenin for telephonic assessment, no answer to telephone [phone] and no option to leave voicemail.   PLAN Outreach pt tomorrow  Jacqlyn Larsen [facility], [place] Coordinator [phone]

## 2017-09-07 ENCOUNTER — Other Ambulatory Visit: Payer: Self-pay | Admitting: *Deleted

## 2017-09-07 NOTE — Patient Outreach (Signed)
Telephone call to patient's daughter for telephonic assessment, no answer to telephone, no option to leave voicemail.  PLAN Letter mailed, outreach pt in 10 business days If no response at that time, close case  Jacqlyn Larsen [facility], [place] Coordinator [phone]

## 2017-09-08 ENCOUNTER — Encounter: Payer: Self-pay | Admitting: *Deleted

## 2017-09-15 ENCOUNTER — Other Ambulatory Visit (HOSPITAL_COMMUNITY): Payer: Self-pay | Admitting: Oncology

## 2017-09-15 DIAGNOSIS — C609 Malignant neoplasm of penis, unspecified: Secondary | ICD-10-CM

## 2017-09-20 ENCOUNTER — Encounter (HOSPITAL_COMMUNITY): Payer: Medicare Other

## 2017-09-21 ENCOUNTER — Encounter: Payer: Self-pay | Admitting: *Deleted

## 2017-09-21 ENCOUNTER — Other Ambulatory Visit: Payer: Self-pay | Admitting: *Deleted

## 2017-09-21 DIAGNOSIS — Z923 Personal history of irradiation: Secondary | ICD-10-CM | POA: Diagnosis not present

## 2017-09-21 DIAGNOSIS — Z86718 Personal history of other venous thrombosis and embolism: Secondary | ICD-10-CM | POA: Diagnosis not present

## 2017-09-21 DIAGNOSIS — Z792 Long term (current) use of antibiotics: Secondary | ICD-10-CM | POA: Diagnosis not present

## 2017-09-21 DIAGNOSIS — M1611 Unilateral primary osteoarthritis, right hip: Secondary | ICD-10-CM | POA: Diagnosis not present

## 2017-09-21 DIAGNOSIS — M47816 Spondylosis without myelopathy or radiculopathy, lumbar region: Secondary | ICD-10-CM | POA: Diagnosis not present

## 2017-09-21 DIAGNOSIS — C774 Secondary and unspecified malignant neoplasm of inguinal and lower limb lymph nodes: Secondary | ICD-10-CM | POA: Diagnosis not present

## 2017-09-21 DIAGNOSIS — E785 Hyperlipidemia, unspecified: Secondary | ICD-10-CM | POA: Diagnosis not present

## 2017-09-21 DIAGNOSIS — E871 Hypo-osmolality and hyponatremia: Secondary | ICD-10-CM | POA: Diagnosis not present

## 2017-09-21 DIAGNOSIS — I2510 Atherosclerotic heart disease of native coronary artery without angina pectoris: Secondary | ICD-10-CM | POA: Diagnosis not present

## 2017-09-21 DIAGNOSIS — I700 Atherosclerosis of aorta: Secondary | ICD-10-CM | POA: Diagnosis not present

## 2017-09-21 DIAGNOSIS — D759 Disease of blood and blood-forming organs, unspecified: Secondary | ICD-10-CM | POA: Diagnosis not present

## 2017-09-21 DIAGNOSIS — M5136 Other intervertebral disc degeneration, lumbar region: Secondary | ICD-10-CM | POA: Diagnosis not present

## 2017-09-21 DIAGNOSIS — Z96641 Presence of right artificial hip joint: Secondary | ICD-10-CM | POA: Diagnosis not present

## 2017-09-21 DIAGNOSIS — M1612 Unilateral primary osteoarthritis, left hip: Secondary | ICD-10-CM | POA: Diagnosis not present

## 2017-09-21 DIAGNOSIS — J439 Emphysema, unspecified: Secondary | ICD-10-CM | POA: Diagnosis not present

## 2017-09-21 DIAGNOSIS — I10 Essential (primary) hypertension: Secondary | ICD-10-CM | POA: Diagnosis not present

## 2017-09-21 DIAGNOSIS — M170 Bilateral primary osteoarthritis of knee: Secondary | ICD-10-CM | POA: Diagnosis not present

## 2017-09-21 DIAGNOSIS — Z7982 Long term (current) use of aspirin: Secondary | ICD-10-CM | POA: Diagnosis not present

## 2017-09-21 DIAGNOSIS — R948 Abnormal results of function studies of other organs and systems: Secondary | ICD-10-CM | POA: Diagnosis not present

## 2017-09-21 DIAGNOSIS — R600 Localized edema: Secondary | ICD-10-CM | POA: Diagnosis not present

## 2017-09-21 DIAGNOSIS — Z9221 Personal history of antineoplastic chemotherapy: Secondary | ICD-10-CM | POA: Diagnosis not present

## 2017-09-21 DIAGNOSIS — I77811 Abdominal aortic ectasia: Secondary | ICD-10-CM | POA: Diagnosis not present

## 2017-09-21 NOTE — Patient Outreach (Signed)
Telephone call to pt for telephone assessment/  3rd attempt, no answer to telephone and no option to leave voicemail.  Pt did not respond to unsuccessful outreach letter.  RN CM mailed case closure letter to patient's home and faxed case closure letter to primary MD Dr. Quintin Alto.  PLAN Close case  Jacqlyn Larsen [facility], [place] Coordinator [phone]

## 2017-09-23 DIAGNOSIS — Z5111 Encounter for antineoplastic chemotherapy: Secondary | ICD-10-CM | POA: Diagnosis not present

## 2017-09-27 ENCOUNTER — Encounter (HOSPITAL_COMMUNITY)
Admission: RE | Admit: 2017-09-27 | Discharge: 2017-09-27 | Disposition: A | Discharge status: Home / Self Care | Payer: Medicare Other | Source: Ambulatory Visit | Attending: Oncology | Admitting: Oncology

## 2017-09-27 DIAGNOSIS — C609 Malignant neoplasm of penis, unspecified: Secondary | ICD-10-CM | POA: Diagnosis present

## 2017-09-27 LAB — GLUCOSE, CAPILLARY: Glucose-Capillary: 94 mg/dL (ref 65–99)

## 2017-09-27 MED ORDER — FLUDEOXYGLUCOSE F - 18 (FDG) INJECTION
13.6300 | Freq: Once | INTRAVENOUS | Status: AC | PRN
  Administered 2017-09-27: 13.63 via INTRAVENOUS

## 2017-09-28 DIAGNOSIS — Z79899 Other long term (current) drug therapy: Secondary | ICD-10-CM | POA: Diagnosis not present

## 2017-09-28 DIAGNOSIS — I700 Atherosclerosis of aorta: Secondary | ICD-10-CM | POA: Diagnosis not present

## 2017-09-28 DIAGNOSIS — E785 Hyperlipidemia, unspecified: Secondary | ICD-10-CM | POA: Diagnosis not present

## 2017-09-28 DIAGNOSIS — Z5111 Encounter for antineoplastic chemotherapy: Secondary | ICD-10-CM | POA: Diagnosis not present

## 2017-09-28 DIAGNOSIS — J449 Chronic obstructive pulmonary disease, unspecified: Secondary | ICD-10-CM | POA: Diagnosis not present

## 2017-09-28 DIAGNOSIS — I10 Essential (primary) hypertension: Secondary | ICD-10-CM | POA: Diagnosis not present

## 2017-09-28 DIAGNOSIS — M1990 Unspecified osteoarthritis, unspecified site: Secondary | ICD-10-CM | POA: Diagnosis not present

## 2017-09-28 DIAGNOSIS — Z515 Encounter for palliative care: Secondary | ICD-10-CM | POA: Diagnosis not present

## 2017-09-30 DIAGNOSIS — Z5111 Encounter for antineoplastic chemotherapy: Secondary | ICD-10-CM | POA: Diagnosis not present

## 2017-10-05 DIAGNOSIS — M47816 Spondylosis without myelopathy or radiculopathy, lumbar region: Secondary | ICD-10-CM | POA: Diagnosis not present

## 2017-10-05 DIAGNOSIS — I2510 Atherosclerotic heart disease of native coronary artery without angina pectoris: Secondary | ICD-10-CM | POA: Diagnosis not present

## 2017-10-05 DIAGNOSIS — I77811 Abdominal aortic ectasia: Secondary | ICD-10-CM | POA: Diagnosis not present

## 2017-10-05 DIAGNOSIS — M5136 Other intervertebral disc degeneration, lumbar region: Secondary | ICD-10-CM | POA: Diagnosis not present

## 2017-10-05 DIAGNOSIS — Z862 Personal history of diseases of the blood and blood-forming organs and certain disorders involving the immune mechanism: Secondary | ICD-10-CM | POA: Diagnosis not present

## 2017-10-05 DIAGNOSIS — Z515 Encounter for palliative care: Secondary | ICD-10-CM | POA: Diagnosis not present

## 2017-10-05 DIAGNOSIS — Z880 Allergy status to penicillin: Secondary | ICD-10-CM | POA: Diagnosis not present

## 2017-10-05 DIAGNOSIS — Z923 Personal history of irradiation: Secondary | ICD-10-CM | POA: Diagnosis not present

## 2017-10-05 DIAGNOSIS — Z86718 Personal history of other venous thrombosis and embolism: Secondary | ICD-10-CM | POA: Diagnosis not present

## 2017-10-05 DIAGNOSIS — R2681 Unsteadiness on feet: Secondary | ICD-10-CM | POA: Diagnosis not present

## 2017-10-05 DIAGNOSIS — M1612 Unilateral primary osteoarthritis, left hip: Secondary | ICD-10-CM | POA: Diagnosis not present

## 2017-10-05 DIAGNOSIS — E871 Hypo-osmolality and hyponatremia: Secondary | ICD-10-CM | POA: Diagnosis not present

## 2017-10-05 DIAGNOSIS — Z7951 Long term (current) use of inhaled steroids: Secondary | ICD-10-CM | POA: Diagnosis not present

## 2017-10-05 DIAGNOSIS — I700 Atherosclerosis of aorta: Secondary | ICD-10-CM | POA: Diagnosis not present

## 2017-10-05 DIAGNOSIS — Z79899 Other long term (current) drug therapy: Secondary | ICD-10-CM | POA: Diagnosis not present

## 2017-10-05 DIAGNOSIS — R609 Edema, unspecified: Secondary | ICD-10-CM | POA: Diagnosis not present

## 2017-10-05 DIAGNOSIS — H9193 Unspecified hearing loss, bilateral: Secondary | ICD-10-CM | POA: Diagnosis not present

## 2017-10-05 DIAGNOSIS — C774 Secondary and unspecified malignant neoplasm of inguinal and lower limb lymph nodes: Secondary | ICD-10-CM | POA: Diagnosis not present

## 2017-10-05 DIAGNOSIS — Z9289 Personal history of other medical treatment: Secondary | ICD-10-CM | POA: Diagnosis not present

## 2017-10-05 DIAGNOSIS — M170 Bilateral primary osteoarthritis of knee: Secondary | ICD-10-CM | POA: Diagnosis not present

## 2017-10-05 DIAGNOSIS — J439 Emphysema, unspecified: Secondary | ICD-10-CM | POA: Diagnosis not present

## 2017-10-05 DIAGNOSIS — Z5111 Encounter for antineoplastic chemotherapy: Secondary | ICD-10-CM | POA: Diagnosis not present

## 2017-10-05 DIAGNOSIS — I10 Essential (primary) hypertension: Secondary | ICD-10-CM | POA: Diagnosis not present

## 2017-10-05 DIAGNOSIS — E785 Hyperlipidemia, unspecified: Secondary | ICD-10-CM | POA: Diagnosis not present

## 2017-10-19 DIAGNOSIS — Z5111 Encounter for antineoplastic chemotherapy: Secondary | ICD-10-CM | POA: Diagnosis not present

## 2017-10-19 DIAGNOSIS — Z79899 Other long term (current) drug therapy: Secondary | ICD-10-CM | POA: Diagnosis not present

## 2017-10-19 DIAGNOSIS — J449 Chronic obstructive pulmonary disease, unspecified: Secondary | ICD-10-CM | POA: Diagnosis not present

## 2017-10-19 DIAGNOSIS — E785 Hyperlipidemia, unspecified: Secondary | ICD-10-CM | POA: Diagnosis not present

## 2017-10-19 DIAGNOSIS — Z515 Encounter for palliative care: Secondary | ICD-10-CM | POA: Diagnosis not present

## 2017-10-19 DIAGNOSIS — I10 Essential (primary) hypertension: Secondary | ICD-10-CM | POA: Diagnosis not present

## 2017-10-21 DIAGNOSIS — Z5111 Encounter for antineoplastic chemotherapy: Secondary | ICD-10-CM | POA: Diagnosis not present

## 2017-10-26 DIAGNOSIS — E871 Hypo-osmolality and hyponatremia: Secondary | ICD-10-CM | POA: Diagnosis not present

## 2017-10-26 DIAGNOSIS — R599 Enlarged lymph nodes, unspecified: Secondary | ICD-10-CM | POA: Diagnosis not present

## 2017-10-26 DIAGNOSIS — R5382 Chronic fatigue, unspecified: Secondary | ICD-10-CM | POA: Diagnosis not present

## 2017-10-28 DIAGNOSIS — Z79899 Other long term (current) drug therapy: Secondary | ICD-10-CM | POA: Diagnosis not present

## 2017-10-28 DIAGNOSIS — Z5111 Encounter for antineoplastic chemotherapy: Secondary | ICD-10-CM | POA: Diagnosis not present

## 2017-11-04 DIAGNOSIS — Z5111 Encounter for antineoplastic chemotherapy: Secondary | ICD-10-CM | POA: Diagnosis not present

## 2017-11-05 DIAGNOSIS — E871 Hypo-osmolality and hyponatremia: Secondary | ICD-10-CM | POA: Diagnosis not present

## 2017-11-10 DIAGNOSIS — E871 Hypo-osmolality and hyponatremia: Secondary | ICD-10-CM | POA: Diagnosis not present

## 2017-11-10 DIAGNOSIS — E876 Hypokalemia: Secondary | ICD-10-CM | POA: Diagnosis not present

## 2017-11-16 DIAGNOSIS — Z515 Encounter for palliative care: Secondary | ICD-10-CM | POA: Diagnosis not present

## 2017-11-16 DIAGNOSIS — R600 Localized edema: Secondary | ICD-10-CM | POA: Diagnosis not present

## 2017-11-16 DIAGNOSIS — R2681 Unsteadiness on feet: Secondary | ICD-10-CM | POA: Diagnosis not present

## 2017-11-16 DIAGNOSIS — I10 Essential (primary) hypertension: Secondary | ICD-10-CM | POA: Diagnosis not present

## 2017-11-16 DIAGNOSIS — H9193 Unspecified hearing loss, bilateral: Secondary | ICD-10-CM | POA: Diagnosis not present

## 2017-11-16 DIAGNOSIS — Z79899 Other long term (current) drug therapy: Secondary | ICD-10-CM | POA: Diagnosis not present

## 2017-11-16 DIAGNOSIS — J449 Chronic obstructive pulmonary disease, unspecified: Secondary | ICD-10-CM | POA: Diagnosis not present

## 2017-11-16 DIAGNOSIS — C774 Secondary and unspecified malignant neoplasm of inguinal and lower limb lymph nodes: Secondary | ICD-10-CM | POA: Diagnosis not present

## 2017-11-16 DIAGNOSIS — E871 Hypo-osmolality and hyponatremia: Secondary | ICD-10-CM | POA: Diagnosis not present

## 2017-11-16 DIAGNOSIS — Z5111 Encounter for antineoplastic chemotherapy: Secondary | ICD-10-CM | POA: Diagnosis not present

## 2017-11-16 DIAGNOSIS — Z923 Personal history of irradiation: Secondary | ICD-10-CM | POA: Diagnosis not present

## 2017-11-16 DIAGNOSIS — M170 Bilateral primary osteoarthritis of knee: Secondary | ICD-10-CM | POA: Diagnosis not present

## 2017-11-16 DIAGNOSIS — Z7982 Long term (current) use of aspirin: Secondary | ICD-10-CM | POA: Diagnosis not present

## 2017-11-16 DIAGNOSIS — Z880 Allergy status to penicillin: Secondary | ICD-10-CM | POA: Diagnosis not present

## 2017-11-16 DIAGNOSIS — E785 Hyperlipidemia, unspecified: Secondary | ICD-10-CM | POA: Diagnosis not present

## 2017-11-23 DIAGNOSIS — D61818 Other pancytopenia: Secondary | ICD-10-CM | POA: Diagnosis not present

## 2017-11-23 DIAGNOSIS — Z880 Allergy status to penicillin: Secondary | ICD-10-CM | POA: Diagnosis not present

## 2017-11-23 DIAGNOSIS — Z5111 Encounter for antineoplastic chemotherapy: Secondary | ICD-10-CM | POA: Diagnosis not present

## 2017-11-23 DIAGNOSIS — Z923 Personal history of irradiation: Secondary | ICD-10-CM | POA: Diagnosis not present

## 2017-11-23 DIAGNOSIS — J439 Emphysema, unspecified: Secondary | ICD-10-CM | POA: Diagnosis not present

## 2017-11-23 DIAGNOSIS — Z515 Encounter for palliative care: Secondary | ICD-10-CM | POA: Diagnosis not present

## 2017-11-23 DIAGNOSIS — H9193 Unspecified hearing loss, bilateral: Secondary | ICD-10-CM | POA: Diagnosis not present

## 2017-11-23 DIAGNOSIS — Z96641 Presence of right artificial hip joint: Secondary | ICD-10-CM | POA: Diagnosis not present

## 2017-11-23 DIAGNOSIS — Z7982 Long term (current) use of aspirin: Secondary | ICD-10-CM | POA: Diagnosis not present

## 2017-11-23 DIAGNOSIS — Z86718 Personal history of other venous thrombosis and embolism: Secondary | ICD-10-CM | POA: Diagnosis not present

## 2017-11-23 DIAGNOSIS — E871 Hypo-osmolality and hyponatremia: Secondary | ICD-10-CM | POA: Diagnosis not present

## 2017-11-23 DIAGNOSIS — M170 Bilateral primary osteoarthritis of knee: Secondary | ICD-10-CM | POA: Diagnosis not present

## 2017-11-23 DIAGNOSIS — Z79899 Other long term (current) drug therapy: Secondary | ICD-10-CM | POA: Diagnosis not present

## 2017-11-23 DIAGNOSIS — I10 Essential (primary) hypertension: Secondary | ICD-10-CM | POA: Diagnosis not present

## 2017-11-23 DIAGNOSIS — D61810 Antineoplastic chemotherapy induced pancytopenia: Secondary | ICD-10-CM | POA: Diagnosis not present

## 2017-11-23 DIAGNOSIS — E785 Hyperlipidemia, unspecified: Secondary | ICD-10-CM | POA: Diagnosis not present

## 2017-11-23 DIAGNOSIS — R600 Localized edema: Secondary | ICD-10-CM | POA: Diagnosis not present

## 2017-11-23 DIAGNOSIS — C774 Secondary and unspecified malignant neoplasm of inguinal and lower limb lymph nodes: Secondary | ICD-10-CM | POA: Diagnosis not present

## 2017-11-25 DIAGNOSIS — Z5111 Encounter for antineoplastic chemotherapy: Secondary | ICD-10-CM | POA: Diagnosis not present

## 2017-12-02 DIAGNOSIS — H26493 Other secondary cataract, bilateral: Secondary | ICD-10-CM | POA: Diagnosis not present

## 2017-12-02 DIAGNOSIS — Z5111 Encounter for antineoplastic chemotherapy: Secondary | ICD-10-CM | POA: Diagnosis not present

## 2017-12-07 DIAGNOSIS — Z515 Encounter for palliative care: Secondary | ICD-10-CM | POA: Diagnosis not present

## 2017-12-07 DIAGNOSIS — Z5111 Encounter for antineoplastic chemotherapy: Secondary | ICD-10-CM | POA: Diagnosis not present

## 2017-12-07 DIAGNOSIS — D61818 Other pancytopenia: Secondary | ICD-10-CM | POA: Diagnosis not present

## 2017-12-09 DIAGNOSIS — J449 Chronic obstructive pulmonary disease, unspecified: Secondary | ICD-10-CM | POA: Diagnosis not present

## 2017-12-09 DIAGNOSIS — Z7982 Long term (current) use of aspirin: Secondary | ICD-10-CM | POA: Diagnosis not present

## 2017-12-09 DIAGNOSIS — I10 Essential (primary) hypertension: Secondary | ICD-10-CM | POA: Diagnosis not present

## 2017-12-09 DIAGNOSIS — E785 Hyperlipidemia, unspecified: Secondary | ICD-10-CM | POA: Diagnosis not present

## 2017-12-09 DIAGNOSIS — Z880 Allergy status to penicillin: Secondary | ICD-10-CM | POA: Diagnosis not present

## 2017-12-09 DIAGNOSIS — Z86718 Personal history of other venous thrombosis and embolism: Secondary | ICD-10-CM | POA: Diagnosis not present

## 2017-12-09 DIAGNOSIS — M1990 Unspecified osteoarthritis, unspecified site: Secondary | ICD-10-CM | POA: Diagnosis not present

## 2017-12-09 DIAGNOSIS — Z5111 Encounter for antineoplastic chemotherapy: Secondary | ICD-10-CM | POA: Diagnosis not present

## 2017-12-09 DIAGNOSIS — E871 Hypo-osmolality and hyponatremia: Secondary | ICD-10-CM | POA: Diagnosis not present

## 2017-12-10 ENCOUNTER — Other Ambulatory Visit (HOSPITAL_COMMUNITY): Payer: Self-pay | Admitting: Oncology

## 2017-12-10 DIAGNOSIS — C609 Malignant neoplasm of penis, unspecified: Secondary | ICD-10-CM

## 2017-12-16 ENCOUNTER — Ambulatory Visit (HOSPITAL_COMMUNITY)
Admission: RE | Admit: 2017-12-16 | Discharge: 2017-12-16 | Disposition: A | Discharge status: Home / Self Care | Payer: Medicare Other | Source: Ambulatory Visit | Attending: Oncology | Admitting: Oncology

## 2017-12-16 DIAGNOSIS — C602 Malignant neoplasm of body of penis: Secondary | ICD-10-CM | POA: Insufficient documentation

## 2017-12-16 DIAGNOSIS — C609 Malignant neoplasm of penis, unspecified: Secondary | ICD-10-CM

## 2017-12-16 LAB — GLUCOSE, CAPILLARY: Glucose-Capillary: 93 mg/dL (ref 65–99)

## 2017-12-20 DIAGNOSIS — E871 Hypo-osmolality and hyponatremia: Secondary | ICD-10-CM | POA: Diagnosis not present

## 2017-12-20 DIAGNOSIS — N183 Chronic kidney disease, stage 3 (moderate): Secondary | ICD-10-CM | POA: Diagnosis not present

## 2017-12-20 DIAGNOSIS — I10 Essential (primary) hypertension: Secondary | ICD-10-CM | POA: Diagnosis not present

## 2017-12-20 DIAGNOSIS — E876 Hypokalemia: Secondary | ICD-10-CM | POA: Diagnosis not present

## 2017-12-23 DIAGNOSIS — J439 Emphysema, unspecified: Secondary | ICD-10-CM | POA: Diagnosis not present

## 2017-12-23 DIAGNOSIS — I2510 Atherosclerotic heart disease of native coronary artery without angina pectoris: Secondary | ICD-10-CM | POA: Diagnosis not present

## 2017-12-23 DIAGNOSIS — Z79899 Other long term (current) drug therapy: Secondary | ICD-10-CM | POA: Diagnosis not present

## 2017-12-23 DIAGNOSIS — Z7982 Long term (current) use of aspirin: Secondary | ICD-10-CM | POA: Diagnosis not present

## 2017-12-31 DIAGNOSIS — I10 Essential (primary) hypertension: Secondary | ICD-10-CM | POA: Diagnosis not present

## 2017-12-31 DIAGNOSIS — N183 Chronic kidney disease, stage 3 (moderate): Secondary | ICD-10-CM | POA: Diagnosis not present

## 2017-12-31 DIAGNOSIS — D61810 Antineoplastic chemotherapy induced pancytopenia: Secondary | ICD-10-CM | POA: Diagnosis not present

## 2017-12-31 DIAGNOSIS — J438 Other emphysema: Secondary | ICD-10-CM | POA: Diagnosis not present

## 2018-01-06 DIAGNOSIS — R5383 Other fatigue: Secondary | ICD-10-CM | POA: Diagnosis not present

## 2018-01-06 DIAGNOSIS — I10 Essential (primary) hypertension: Secondary | ICD-10-CM | POA: Diagnosis not present

## 2018-01-06 DIAGNOSIS — R600 Localized edema: Secondary | ICD-10-CM | POA: Diagnosis not present

## 2018-01-06 DIAGNOSIS — Z7951 Long term (current) use of inhaled steroids: Secondary | ICD-10-CM | POA: Diagnosis not present

## 2018-01-06 DIAGNOSIS — J3489 Other specified disorders of nose and nasal sinuses: Secondary | ICD-10-CM | POA: Diagnosis not present

## 2018-01-06 DIAGNOSIS — J189 Pneumonia, unspecified organism: Secondary | ICD-10-CM | POA: Diagnosis not present

## 2018-01-06 DIAGNOSIS — J449 Chronic obstructive pulmonary disease, unspecified: Secondary | ICD-10-CM | POA: Diagnosis not present

## 2018-01-06 DIAGNOSIS — Z880 Allergy status to penicillin: Secondary | ICD-10-CM | POA: Diagnosis not present

## 2018-01-06 DIAGNOSIS — R978 Other abnormal tumor markers: Secondary | ICD-10-CM | POA: Diagnosis not present

## 2018-01-06 DIAGNOSIS — R05 Cough: Secondary | ICD-10-CM | POA: Diagnosis not present

## 2018-01-06 DIAGNOSIS — E785 Hyperlipidemia, unspecified: Secondary | ICD-10-CM | POA: Diagnosis not present

## 2018-01-06 DIAGNOSIS — R0602 Shortness of breath: Secondary | ICD-10-CM | POA: Diagnosis not present

## 2018-01-06 DIAGNOSIS — M170 Bilateral primary osteoarthritis of knee: Secondary | ICD-10-CM | POA: Diagnosis not present

## 2018-01-11 DIAGNOSIS — L049 Acute lymphadenitis, unspecified: Secondary | ICD-10-CM | POA: Diagnosis not present

## 2018-01-11 DIAGNOSIS — J189 Pneumonia, unspecified organism: Secondary | ICD-10-CM | POA: Diagnosis not present

## 2018-01-11 DIAGNOSIS — Z923 Personal history of irradiation: Secondary | ICD-10-CM | POA: Diagnosis not present

## 2018-01-11 DIAGNOSIS — Z515 Encounter for palliative care: Secondary | ICD-10-CM | POA: Diagnosis not present

## 2018-01-14 DIAGNOSIS — Z923 Personal history of irradiation: Secondary | ICD-10-CM | POA: Diagnosis not present

## 2018-01-14 DIAGNOSIS — R319 Hematuria, unspecified: Secondary | ICD-10-CM | POA: Diagnosis not present

## 2018-01-14 DIAGNOSIS — H9190 Unspecified hearing loss, unspecified ear: Secondary | ICD-10-CM | POA: Diagnosis not present

## 2018-01-14 DIAGNOSIS — Z96641 Presence of right artificial hip joint: Secondary | ICD-10-CM | POA: Diagnosis not present

## 2018-01-14 DIAGNOSIS — M1611 Unilateral primary osteoarthritis, right hip: Secondary | ICD-10-CM | POA: Diagnosis not present

## 2018-01-14 DIAGNOSIS — J439 Emphysema, unspecified: Secondary | ICD-10-CM | POA: Diagnosis not present

## 2018-01-14 DIAGNOSIS — Z7982 Long term (current) use of aspirin: Secondary | ICD-10-CM | POA: Diagnosis not present

## 2018-01-14 DIAGNOSIS — N433 Hydrocele, unspecified: Secondary | ICD-10-CM | POA: Diagnosis not present

## 2018-01-14 DIAGNOSIS — I10 Essential (primary) hypertension: Secondary | ICD-10-CM | POA: Diagnosis not present

## 2018-01-14 DIAGNOSIS — Z880 Allergy status to penicillin: Secondary | ICD-10-CM | POA: Diagnosis not present

## 2018-01-14 DIAGNOSIS — R1904 Left lower quadrant abdominal swelling, mass and lump: Secondary | ICD-10-CM | POA: Diagnosis not present

## 2018-01-14 DIAGNOSIS — E785 Hyperlipidemia, unspecified: Secondary | ICD-10-CM | POA: Diagnosis not present

## 2018-01-14 DIAGNOSIS — N390 Urinary tract infection, site not specified: Secondary | ICD-10-CM | POA: Diagnosis not present

## 2018-01-14 DIAGNOSIS — Z9221 Personal history of antineoplastic chemotherapy: Secondary | ICD-10-CM | POA: Diagnosis not present

## 2018-01-14 DIAGNOSIS — Z79899 Other long term (current) drug therapy: Secondary | ICD-10-CM | POA: Diagnosis not present

## 2018-01-17 DIAGNOSIS — Z86718 Personal history of other venous thrombosis and embolism: Secondary | ICD-10-CM | POA: Diagnosis not present

## 2018-01-17 DIAGNOSIS — R5383 Other fatigue: Secondary | ICD-10-CM | POA: Diagnosis not present

## 2018-01-17 DIAGNOSIS — R609 Edema, unspecified: Secondary | ICD-10-CM | POA: Diagnosis not present

## 2018-01-20 DIAGNOSIS — Z0130 Encounter for examination of blood pressure without abnormal findings: Secondary | ICD-10-CM | POA: Diagnosis not present

## 2018-01-25 DIAGNOSIS — R1909 Other intra-abdominal and pelvic swelling, mass and lump: Secondary | ICD-10-CM | POA: Diagnosis not present

## 2018-02-12 DIAGNOSIS — R599 Enlarged lymph nodes, unspecified: Secondary | ICD-10-CM | POA: Diagnosis not present

## 2018-02-12 DIAGNOSIS — K529 Noninfective gastroenteritis and colitis, unspecified: Secondary | ICD-10-CM | POA: Diagnosis not present

## 2018-02-12 DIAGNOSIS — E7800 Pure hypercholesterolemia, unspecified: Secondary | ICD-10-CM | POA: Diagnosis not present

## 2018-02-12 DIAGNOSIS — I10 Essential (primary) hypertension: Secondary | ICD-10-CM | POA: Diagnosis not present

## 2018-02-12 DIAGNOSIS — R1909 Other intra-abdominal and pelvic swelling, mass and lump: Secondary | ICD-10-CM | POA: Diagnosis not present

## 2018-02-16 DIAGNOSIS — Z923 Personal history of irradiation: Secondary | ICD-10-CM | POA: Diagnosis not present

## 2018-02-23 DIAGNOSIS — Z923 Personal history of irradiation: Secondary | ICD-10-CM | POA: Diagnosis not present

## 2018-02-24 DIAGNOSIS — Z923 Personal history of irradiation: Secondary | ICD-10-CM | POA: Diagnosis not present

## 2018-04-12 DEATH — deceased

## 2018-11-08 ENCOUNTER — Other Ambulatory Visit: Payer: Self-pay | Admitting: *Deleted

## 2020-02-09 IMAGING — CT NM PET TUM IMG RESTAG (PS) SKULL BASE T - THIGH
1 of 7 series · 1 of 25 positions shown · non-contrast
Comparison: 3 PET-CT 09/27/2017

CLINICAL DATA: Subsequent treatment strategy for penile carcinoma..

EXAM:
NUCLEAR MEDICINE PET SKULL BASE TO THIGH
TECHNIQUE: 12.7 mCi F-18 FDG was injected intravenously. Full-ring PET imaging
was performed from the skull base to thigh after the radiotracer. CT
data was obtained and used for attenuation correction and anatomic
localization.
Fasting blood glucose: 93 mg/dl

[Series 4: ct sk_thigh 5.0 b31f · axial · 5.0mm · 0.98mm/px · 1 of 255 slices shown]
[im 255/255  brain]
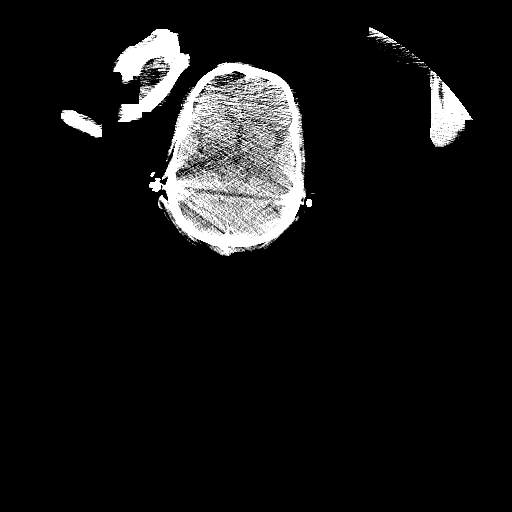

[1 of 25 positions shown; findings below may reference images not displayed]

FINDINGS: Mediastinal blood pool activity: SUV max

NECK: No hypermetabolic lymph nodes in the neck.

Resolution of metabolic activity in the LEFT external auditory canal
described on prior

Incidental CT findings: none

CHEST: No hypermetabolic mediastinal or hilar nodes. No suspicious
pulmonary nodules on the CT scan.

Incidental CT findings: none

ABDOMEN/PELVIS: No abnormal hypermetabolic activity within the
liver, pancreas, adrenal glands, or spleen. No hypermetabolic lymph
nodes in the abdomen or pelvis.

Persistent intense activity associated with the large necrotic LEFT
inguinal lymph node. The lymph node measures 7.3 cm in diameter
increased from 5.2 cm. Lymph node is centrally necrotic and with
intensely hypermetabolic nodular thickening peripherally. The
nodular hypermetabolic peripheral thickening is increased in
metabolic activity SUV max equal 29 increased from 21. The central
necrosis is increased new.

A single LEFT external iliac lymph node measuring 1 cm with SUV max
equal 6.4 similar to 5.9 on prior. No additional pelvic adenopathy.

Physiologic activity noted in the LEFT and RIGHT testicle.

Incidental CT findings: none

SKELETON: No focal hypermetabolic activity to suggest skeletal
metastasis.

Incidental CT findings: none
IMPRESSION: 1. Continued increase in size and new central necrosis of large
hypermetabolic metastatic LEFT inguinal lymph node.
2. Small LEFT external iliac lymph node is also intensely
hypermetabolic similar prior
3. No evidence of new metastatic adenopathy in the pelvis and no
evidence distant metastatic disease.
